# Patient Record
Sex: Male | Born: 1985
Health system: Southern US, Community
[De-identification: ages and names within clinical notes are randomized; demographics above are authoritative.]

## PROBLEM LIST (undated history)

## (undated) DIAGNOSIS — K219 Gastro-esophageal reflux disease without esophagitis: Secondary | ICD-10-CM

## (undated) HISTORY — DX: Gastro-esophageal reflux disease without esophagitis: K21.9

## (undated) HISTORY — PX: FRACTURE SURGERY: SHX138

---

## 2012-06-11 ENCOUNTER — Ambulatory Visit: Payer: Self-pay

## 2012-06-11 ENCOUNTER — Other Ambulatory Visit: Payer: Self-pay | Admitting: Occupational Medicine

## 2012-06-11 DIAGNOSIS — R52 Pain, unspecified: Secondary | ICD-10-CM

## 2012-12-03 ENCOUNTER — Ambulatory Visit (INDEPENDENT_AMBULATORY_CARE_PROVIDER_SITE_OTHER): Payer: BC Managed Care – PPO | Admitting: Physician Assistant

## 2012-12-03 VITALS — BP 120/66 | HR 50 | Temp 98.5°F | Resp 16 | Ht 67.5 in | Wt 176.0 lb

## 2012-12-03 DIAGNOSIS — J029 Acute pharyngitis, unspecified: Secondary | ICD-10-CM

## 2012-12-03 DIAGNOSIS — J069 Acute upper respiratory infection, unspecified: Secondary | ICD-10-CM

## 2012-12-03 MED ORDER — BENZONATATE 100 MG PO CAPS: 100.0000 mg | ORAL_CAPSULE | Freq: Three times a day (TID) | ORAL | Status: DC | PRN

## 2012-12-03 MED ORDER — IPRATROPIUM BROMIDE 0.03 % NA SOLN: 2.0000 | Freq: Two times a day (BID) | NASAL | Status: DC

## 2012-12-03 NOTE — Progress Notes (Signed)
  Subjective:    Patient ID: Jonathan Huff., male    DOB: May 30, 1986, 27 y.o.   MRN: [id number]  HPI   Mr. Therien is a very pleasant 27 yr old male here with concern for illness. States he is "trying to get over a cold" but feels like he's not getting any better.  Symptoms started about 5 days ago.  Experiencing sore throat, HA, runny nose, sneezing, coughing.  States he's coughing up "deep yellow" mucus. Does endorse post-nasal drainage.  No SOB or wheezing.  No fever.  Was having body aches initially, but now resolved.  Denies allergy problems.  No GI symptoms.  Fiance has been sick with similar symptoms.  Has been using Dayquil, Nyquil, Aleve for symptoms.  States ST is the worst symptom.  No strep contacts.     Review of Systems  Constitutional: Negative for fever and chills.  HENT: Positive for congestion, sore throat, rhinorrhea, sneezing and postnasal drip. Negative for ear pain.   Respiratory: Positive for cough. Negative for shortness of breath and wheezing.   Cardiovascular: Negative.   Gastrointestinal: Negative.   Musculoskeletal: Negative.   Skin: Negative.   Allergic/Immunologic: Negative for environmental allergies.  Neurological: Positive for headaches.       Objective:   Physical Exam  Vitals reviewed. Constitutional: He is oriented to person, place, and time. He appears well-developed and well-nourished. No distress.  HENT:  Head: Normocephalic and atraumatic.  Right Ear: Tympanic membrane and ear canal normal.  Left Ear: Tympanic membrane and ear canal normal.  Nose: Mucosal edema and rhinorrhea present.  Mouth/Throat: Uvula is midline, oropharynx is clear and moist and mucous membranes are normal.  Post-nasal drainage  Eyes: Conjunctivae are normal. No scleral icterus.  Neck: Neck supple.  Cardiovascular: Normal rate, regular rhythm and normal heart sounds.  Exam reveals no gallop and no friction rub.   No murmur heard. Pulmonary/Chest: Effort normal and  breath sounds normal. He has no decreased breath sounds. He has no wheezes. He has no rhonchi. He has no rales.  Lymphadenopathy:    He has no cervical adenopathy.  Neurological: He is alert and oriented to person, place, and time.  Skin: Skin is warm and dry.  Psychiatric: He has a normal mood and affect. His behavior is normal.     Filed Vitals:   12/03/12 1503  BP: 120/66  Pulse: 50  Temp: 98.5 F (36.9 C)  Resp: 16        Assessment & Plan:  Viral URI with cough - Plan: ipratropium (ATROVENT) 0.03 % nasal spray, benzonatate (TESSALON) 100 MG capsule  Viral pharyngitis   Mr. Okazaki is a 27 yr old male here with sore throat and URI symptoms.  Suspect viral/allergic etiology.  Pt is well-appearing, afebrile, lungs CTA, throat clear.  Will treat symptoms with Atrovent and Tessalon.  Encouraged warm salt water gargles.  Ibuprofen 600mg  TID for pain relief if needed . Push fluids, rest.  Discussed RTC precautions with pt who understands and is in agreement with this plan.

## 2012-12-03 NOTE — Patient Instructions (Addendum)
Begin using the Atrovent nasal spray twice daily - this will help with runny nose and post-nasal drainage.  Start an allergy medicine as well (Claritin/Zyrtec/Allegra) - this will also help with your symptoms of runny nose, sneezing, cough.  Tessalon Perles three times a day if needed for cough.  Warm salt water gargles.  You can take 600mg  ibuprofen every 8 hours if needed for sore throat.  If any of your symptoms are worsening or you start running fevers >101.38F please let me know.   Viral Pharyngitis Viral pharyngitis is a viral infection that produces redness, pain, and swelling (inflammation) of the throat. It can spread from person to person (contagious). CAUSES Viral pharyngitis is caused by inhaling a large amount of certain germs called viruses. Many different viruses cause viral pharyngitis. SYMPTOMS Symptoms of viral pharyngitis include:  Sore throat.  Tiredness.  Stuffy nose.  Low-grade fever.  Congestion.  Cough. TREATMENT Treatment includes rest, drinking plenty of fluids, and the use of over-the-counter medication (approved by your caregiver). HOME CARE INSTRUCTIONS   Drink enough fluids to keep your urine clear or pale yellow.  Eat soft, cold foods such as ice cream, frozen ice pops, or gelatin dessert.  Gargle with warm salt water (1 tsp salt per 1 qt of water).  If over age 3, throat lozenges may be used safely.  Only take over-the-counter or prescription medicines for pain, discomfort, or fever as directed by your caregiver. Do not take aspirin. To help prevent spreading viral pharyngitis to others, avoid:  Mouth-to-mouth contact with others.  Sharing utensils for eating and drinking.  Coughing around others. SEEK MEDICAL CARE IF:   You are better in a few days, then become worse.  You have a fever or pain not helped by pain medicines.  There are any other changes that concern you. Document Released: 05/10/2005 Document Revised: 10/23/2011 Document  Reviewed: 10/06/2010 [facility] Patient Information 2013 [place], [place].

## 2012-12-05 ENCOUNTER — Encounter: Payer: Self-pay | Admitting: Family Medicine

## 2014-04-29 ENCOUNTER — Ambulatory Visit (INDEPENDENT_AMBULATORY_CARE_PROVIDER_SITE_OTHER): Payer: BC Managed Care – PPO | Admitting: Emergency Medicine

## 2014-04-29 ENCOUNTER — Ambulatory Visit (INDEPENDENT_AMBULATORY_CARE_PROVIDER_SITE_OTHER): Payer: BC Managed Care – PPO

## 2014-04-29 VITALS — BP 102/62 | HR 50 | Temp 98.0°F | Resp 16 | Ht 67.0 in | Wt 184.2 lb

## 2014-04-29 DIAGNOSIS — M7989 Other specified soft tissue disorders: Secondary | ICD-10-CM

## 2014-04-29 DIAGNOSIS — S63639A Sprain of interphalangeal joint of unspecified finger, initial encounter: Secondary | ICD-10-CM

## 2014-04-29 NOTE — Patient Instructions (Signed)
Finger Sprain  A finger sprain is a tear in one of the strong, fibrous tissues that connect the bones (ligaments) in your finger. The severity of the sprain depends on how much of the ligament is torn. The tear can be either partial or complete.  CAUSES   Often, sprains are a result of a fall or accident. If you extend your hands to catch an object or to protect yourself, the force of the impact causes the fibers of your ligament to stretch too much. This excess tension causes the fibers of your ligament to tear.  SYMPTOMS   You may have some loss of motion in your finger. Other symptoms include:   Bruising.   Tenderness.   Swelling.  DIAGNOSIS   In order to diagnose finger sprain, your caregiver will physically examine your finger or thumb to determine how torn the ligament is. Your caregiver may also suggest an X-ray exam of your finger to make sure no bones are broken.  TREATMENT   If your ligament is only partially torn, treatment usually involves keeping the finger in a fixed position (immobilization) for a short period. To do this, your caregiver will apply a bandage, cast, or splint to keep your finger from moving until it heals. For a partially torn ligament, the healing process usually takes 2 to 3 weeks.  If your ligament is completely torn, you may need surgery to reconnect the ligament to the bone. After surgery a cast or splint will be applied and will need to stay on your finger or thumb for 4 to 6 weeks while your ligament heals.  HOME CARE INSTRUCTIONS   Keep your injured finger elevated, when possible, to decrease swelling.   To ease pain and swelling, apply ice to your joint twice a day, for 2 to 3 days:   Put ice in a plastic bag.   Place a towel between your skin and the bag.   Leave the ice on for 15 minutes.   Only take over-the-counter or prescription medicine for pain as directed by your caregiver.   Do not wear rings on your injured finger.   Do not leave your finger unprotected  until pain and stiffness go away (usually 3 to 4 weeks).   Do not allow your cast or splint to get wet. Cover your cast or splint with a plastic bag when you shower or bathe. Do not swim.   Your caregiver may suggest special exercises for you to do during your recovery to prevent or limit permanent stiffness.  SEEK IMMEDIATE MEDICAL CARE IF:   Your cast or splint becomes damaged.   Your pain becomes worse rather than better.  MAKE SURE YOU:   Understand these instructions.   Will watch your condition.   Will get help right away if you are not doing well or get worse.  Document Released: 09/07/2004 Document Revised: 10/23/2011 Document Reviewed: 04/03/2011  [employer] Patient Information 2015 [employer]. This information is not intended to replace advice given to you by your health care provider. Make sure you discuss any questions you have with your health care provider.

## 2014-04-29 NOTE — Progress Notes (Signed)
[facility] [address] [place] [address] [phone]  Date:  04/29/2014   Name:  Jonathan Huff.   DOB:  October 15, 1985   MRN:  [id number]  PCP:  No PCP Per Patient    Chief Complaint: Hand Pain   History of Present Illness:  Jonathan Stockman. is a 28 y.o. very pleasant male patient who presents with the following:  Injured his left ring when it was caught on a door.  Has swelling and pain in PIP and unable to remove ring. Has pain and limited motion. No improvement with over the counter medications or other home remedies.  Denies other complaint or health concern today.   There are no active problems to display for this patient.   No past medical history on file.  Past Surgical History  Procedure Laterality Date  . Fracture surgery      History  Substance Use Topics  . Smoking status: Current Some Day Smoker    Types: Cigars  . Smokeless tobacco: Not on file  . Alcohol Use: 2.4 oz/week    4 Cans of beer per week    Family History  Problem Relation Age of Onset  . Cancer Paternal Grandmother   . Pneumonia Paternal Grandfather     No Known Allergies  Medication list has been reviewed and updated.  No current outpatient prescriptions on file prior to visit.   No current facility-administered medications on file prior to visit.    Review of Systems:  As per HPI, otherwise negative.    Physical Examination: Filed Vitals:   04/29/14 2050  BP: 102/62  Pulse: 50  Temp: 98 F (36.7 C)  Resp: 16   Filed Vitals:   04/29/14 2050  Height:  (1.702 m)  Weight: 184 lb 3.2 oz (83.553 kg)   Body mass index is 28.84 kg/(m^2). Ideal Body Weight: Weight in (lb) to have BMI = 25: 159.3   GEN: WDWN, NAD, Non-toxic, Alert & Oriented x 3 HEENT: Atraumatic, Normocephalic.  Ears and Nose: No external deformity. EXTR: No clubbing/cyanosis/edema NEURO: Normal gait.  PSYCH: Normally interactive. Conversant. Not depressed or  anxious appearing.  Calm demeanor.  LEFT hand:  Tender and swollen left fourth finger PIP   Assessment and Plan: PIP sprain Buddy tape  Signed,  Phillips Odor, MD   [facility] reading (PRIMARY) by  Dr. Dareen Piano. negative.

## 2015-01-17 ENCOUNTER — Ambulatory Visit (INDEPENDENT_AMBULATORY_CARE_PROVIDER_SITE_OTHER): Payer: BLUE CROSS/BLUE SHIELD | Admitting: Emergency Medicine

## 2015-01-17 VITALS — BP 114/84 | HR 62 | Temp 98.8°F | Resp 16 | Ht 67.5 in | Wt 195.4 lb

## 2015-01-17 DIAGNOSIS — J029 Acute pharyngitis, unspecified: Secondary | ICD-10-CM | POA: Diagnosis not present

## 2015-01-17 DIAGNOSIS — J302 Other seasonal allergic rhinitis: Secondary | ICD-10-CM

## 2015-01-17 LAB — POCT RAPID STREP A (OFFICE): Rapid Strep A Screen: NEGATIVE

## 2015-01-17 MED ORDER — TRIAMCINOLONE ACETONIDE 55 MCG/ACT NA AERO: 2.0000 | INHALATION_SPRAY | Freq: Every day | NASAL | Status: DC

## 2015-01-17 NOTE — Patient Instructions (Signed)

## 2015-01-17 NOTE — Progress Notes (Signed)
Subjective:  Patient ID: Jonathan PillowGerald Amiri Jr., male    DOB: Jul 27, 1986  Age: 29 y.o. MRN: [id number]  CC: Sore Throat and Cough   HPI Jonathan PillowGerald Voong Jr. presents  With a cough and sore throat. He has clear nasal drainage. He has no knee postnasal drainage. He has no fever or chills. He has no wheezing or shortness of breath. He's had no nausea or vomiting. No stool change. No rash. Her throat is been present for 3 weeks.  He has no improvement with over-the-counter medication.  No outpatient prescriptions prior to visit.   No facility-administered medications prior to visit.    History   Social History  . Marital Status: Married    Spouse Name: N/A  . Number of Children: N/A  . Years of Education: N/A   Social History Main Topics  . Smoking status: Current Some Day Smoker    Types: Cigars  . Smokeless tobacco: Not on file  . Alcohol Use: 2.4 oz/week    4 Cans of beer per week  . Drug Use: No  . Sexual Activity: No   Other Topics Concern  . None   Social History Narrative    Family History  Problem Relation Age of Onset  . Cancer Paternal Grandmother   . Pneumonia Paternal Grandfather     History reviewed. No pertinent past medical history.   Review of Systems  Constitutional: Negative for fever, chills and appetite change.  HENT: Positive for congestion, rhinorrhea and sore throat. Negative for ear pain, postnasal drip and sinus pressure.   Eyes: Negative for pain and redness.  Respiratory: Positive for cough. Negative for shortness of breath and wheezing.   Cardiovascular: Negative for leg swelling.  Gastrointestinal: Negative for nausea, vomiting, abdominal pain, diarrhea, constipation and blood in stool.  Endocrine: Negative for polyuria.  Genitourinary: Negative for dysuria, urgency, frequency and flank pain.  Musculoskeletal: Negative for gait problem.  Skin: Negative for rash.  Neurological: Negative for weakness and headaches.    Psychiatric/Behavioral: Negative for confusion and decreased concentration. The patient is not nervous/anxious.     Objective:  BP 114/84 mmHg  Pulse 62  Temp(Src) 98.8 F (37.1 C) (Oral)  Resp 16  Ht 5' 7.5" (1.715 m)  Wt 195 lb 6 oz (88.622 kg)  BMI 30.13 kg/m2  SpO2 98%  BP Readings from Last 3 Encounters:  01/17/15 114/84  04/29/14 102/62  12/03/12 120/66    Wt Readings from Last 3 Encounters:  01/17/15 195 lb 6 oz (88.622 kg)  04/29/14 184 lb 3.2 oz (83.553 kg)  12/03/12 176 lb (79.833 kg)    Physical Exam  Constitutional: He is oriented to person, place, and time. He appears well-developed and well-nourished.  HENT:  Head: Normocephalic and atraumatic.  Right Ear: External ear normal.  Mouth/Throat: Oropharynx is clear and moist. No oropharyngeal exudate.  Eyes: Conjunctivae are normal. Pupils are equal, round, and reactive to light.  Neck: Normal range of motion. Neck supple.  Cardiovascular: Normal rate and normal heart sounds.   Pulmonary/Chest: Effort normal and breath sounds normal. He has no rales.  Abdominal: Soft. Bowel sounds are normal.  Musculoskeletal: Normal range of motion. He exhibits no edema.  Neurological: He is alert and oriented to person, place, and time.  Skin: Skin is warm and dry.  Psychiatric: He has a normal mood and affect. His behavior is normal. Thought content normal.    No results found for: WBC, HGB, HCT, PLT, GLUCOSE, CHOL, TRIG, HDL, LDLDIRECT, LDLCALC,  ALT, AST, NA, K, CL, CREATININE, BUN, CO2, TSH, PSA, INR, GLUF, HGBA1C, MICROALBUR    .  Assessment & Plan:   Garyson was seen today for sore throat and cough.  Diagnoses and all orders for this visit:  Pharyngitis Orders: -     POCT rapid strep A  Seasonal allergic rhinitis   Mr. Helderman does not currently have medications on file.  No orders of the defined types were placed in this encounter.    Appropriate red flag conditions were discussed with the patient  as well as actions that should be taken.  Patient expressed his understanding.  Follow-up: No Follow-up on file.  Carmelina Dane, MD    Results for orders placed or performed in visit on 01/17/15  POCT rapid strep A  Result Value Ref Range   Rapid Strep A Screen Negative Negative

## 2017-01-17 ENCOUNTER — Encounter: Payer: Self-pay | Admitting: Family Medicine

## 2017-01-17 ENCOUNTER — Ambulatory Visit (INDEPENDENT_AMBULATORY_CARE_PROVIDER_SITE_OTHER): Payer: BLUE CROSS/BLUE SHIELD | Admitting: Family Medicine

## 2017-01-17 VITALS — BP 122/80 | HR 60 | Resp 12 | Ht 67.5 in | Wt 205.5 lb

## 2017-01-17 DIAGNOSIS — E6609 Other obesity due to excess calories: Secondary | ICD-10-CM | POA: Diagnosis not present

## 2017-01-17 DIAGNOSIS — E669 Obesity, unspecified: Secondary | ICD-10-CM | POA: Insufficient documentation

## 2017-01-17 DIAGNOSIS — M25561 Pain in right knee: Secondary | ICD-10-CM

## 2017-01-17 DIAGNOSIS — M25562 Pain in left knee: Secondary | ICD-10-CM

## 2017-01-17 DIAGNOSIS — R29898 Other symptoms and signs involving the musculoskeletal system: Secondary | ICD-10-CM

## 2017-01-17 DIAGNOSIS — G479 Sleep disorder, unspecified: Secondary | ICD-10-CM

## 2017-01-17 DIAGNOSIS — K219 Gastro-esophageal reflux disease without esophagitis: Secondary | ICD-10-CM | POA: Diagnosis not present

## 2017-01-17 DIAGNOSIS — G8929 Other chronic pain: Secondary | ICD-10-CM

## 2017-01-17 DIAGNOSIS — Z6831 Body mass index (BMI) 31.0-31.9, adult: Secondary | ICD-10-CM

## 2017-01-17 DIAGNOSIS — R5383 Other fatigue: Secondary | ICD-10-CM

## 2017-01-17 LAB — CBC
HEMATOCRIT: 46 % (ref 39.0–52.0)
HEMOGLOBIN: 15 g/dL (ref 13.0–17.0)
MCHC: 32.6 g/dL (ref 30.0–36.0)
MCV: 71.3 fl — AB (ref 78.0–100.0)
Platelets: 255 10*3/uL (ref 150.0–400.0)
RBC: 6.44 Mil/uL — ABNORMAL HIGH (ref 4.22–5.81)
RDW: 15.8 % — ABNORMAL HIGH (ref 11.5–15.5)
WBC: 4.3 10*3/uL (ref 4.0–10.5)

## 2017-01-17 LAB — BASIC METABOLIC PANEL
BUN: 10 mg/dL (ref 6–23)
CHLORIDE: 103 meq/L (ref 96–112)
CO2: 28 meq/L (ref 19–32)
CREATININE: 0.98 mg/dL (ref 0.40–1.50)
Calcium: 10.1 mg/dL (ref 8.4–10.5)
GFR: 113.85 mL/min (ref >60.00)
GLUCOSE: 88 mg/dL (ref 70–99)
Potassium: 4.4 mEq/L (ref 3.5–5.1)
Sodium: 139 mEq/L (ref 135–145)

## 2017-01-17 LAB — TSH: TSH: 1.56 u[IU]/mL (ref 0.35–4.50)

## 2017-01-17 MED ORDER — OMEPRAZOLE 20 MG PO CPDR: 20.0000 mg | DELAYED_RELEASE_CAPSULE | Freq: Every day | ORAL | 3 refills | Status: DC

## 2017-01-17 NOTE — Patient Instructions (Signed)
A few things to remember from today's visit:   Fatigue, unspecified type - Plan: Basic metabolic panel, TSH, CBC  Gastroesophageal reflux disease, esophagitis presence not specified - Plan: omeprazole (PRILOSEC) 20 MG capsule  Class 1 obesity due to excess calories without serious comorbidity with body mass index (BMI) of 31.0 to 31.9 in adult  Sleep disorder, unspecified - Plan: Ambulatory referral to Pulmonology  Bilateral chronic knee pain - Plan: Ambulatory referral to Sports Medicine  Popliteal fullness - Plan: Ambulatory referral to Sports Medicine     Avoid foods that make your symptoms worse, for example coffee, chocolate,pepermeint,alcohol, and greasy food. Raising the head of your bed about 6 inches may help with nocturnal symptoms.  Avoid tobacco use. Weight loss (if you are overweight). Avoid lying down for 3 hours after eating.  Instead 3 large meals daily try small and more frequent meals during the day.  Every medication have side effects and medications for GERD are not the exception.At this time I think benefit is greater than risk.   You should be evaluated immediately if bloody vomiting, bloody stools, black stools (like tar), difficulty swallowing, food gets stuck on the way down or choking when eating. Abnormal weight loss or severe abdominal pain.  If symptoms are not resolved sometimes endoscopy is necessary.  Please be sure medication list is accurate. If a new problem present, please set up appointment sooner than planned today.

## 2017-01-20 ENCOUNTER — Encounter: Payer: Self-pay | Admitting: Family Medicine

## 2017-01-20 NOTE — Progress Notes (Signed)
HPI:   [name] is a 31 y.o. male, who is here today to establish care.  Former PCP: Dr Jonathan Huff. Last preventive routine visit: Over 2 years ago.  Chronic medical problems: He denies DM,HTN,or HLD. Knee pain.   Concerns today:   He is concerned about possible OSA. Fatigue for about 6 months, he does not feel rested when he gets up.   He wakes up a couple times to go to bathroom and other times because heartburn if he eats late.   Wife has noted that he is snoring louder. According to pt, in 10/2016 he stayed with his father,who told him that he noted apnea during sleep.   GERD: Heartburn for about 4-6 months, he has episodes about 3 times per week.  Denies abdominal pain, nausea, vomiting, changes in bowel habits, blood in stool or melena.   + Smoker.  He is eating fast food for the 6 months, about 4 times per week. He is not exercising regularly.  -Knee pain, bilateral but L>R, worse for the past 3 weeks,when he tried to go back to running. He denies injuries. Right knee bothers him "every now and then." He has noted tightness sensation on the posterior aspect of left knee. Pain is exacerbated by walking, "very painful", alleviated by rest.  He has not noted erythema or edema. No limitation of ROM. He has not tried OTC analgesics for pain.  He also has occasional shoulders pain.  Review of Systems  Constitutional: Positive for fatigue. Negative for activity change, appetite change, fever and unexpected weight change.  HENT: Negative for mouth sores, nosebleeds, sore throat and trouble swallowing.   Eyes: Negative for redness and visual disturbance.  Respiratory: Positive for apnea. Negative for cough, shortness of breath and wheezing.   Cardiovascular: Negative for chest pain, palpitations and leg swelling.  Gastrointestinal: Negative for abdominal pain, nausea and vomiting.       No changes in bowel habits.   Endocrine: Negative for cold  intolerance and heat intolerance.  Genitourinary: Negative for decreased urine volume, dysuria and hematuria.  Musculoskeletal: Positive for arthralgias. Negative for back pain and gait problem.  Neurological: Negative for weakness, numbness and headaches.  Hematological: Negative for adenopathy. Does not bruise/bleed easily.  Psychiatric/Behavioral: Positive for sleep disturbance. Negative for confusion. The patient is not nervous/anxious.     No current outpatient prescriptions on file prior to visit.   No current facility-administered medications on file prior to visit.     Past Medical History:  Diagnosis Date  . GERD (gastroesophageal reflux disease)    No Known Allergies  Family History  Problem Relation Age of Onset  . Hypertension Mother   . Diabetes Mother   . Hypertension Father   . Cancer Paternal Grandmother   . Pneumonia Paternal Grandfather     Social History   Social History  . Marital status: Married    Spouse name: N/A  . Number of children: N/A  . Years of education: N/A   Social History Main Topics  . Smoking status: Current Some Day Smoker    Types: Cigars  . Smokeless tobacco: Never Used  . Alcohol use 2.4 oz/week    4 Cans of beer per week  . Drug use: No  . Sexual activity: Not Asked   Other Topics Concern  . None   Social History Narrative  . None    Vitals:   01/17/17 1100  BP: 122/80  Pulse: 60  Resp:  12   O2 sat at RA 98% Body mass index is 31.71 kg/m.   Physical Exam  Nursing note and vitals reviewed. Constitutional: He is oriented to person, place, and time. He appears well-developed. No distress.  HENT:  Head: Atraumatic.  Mouth/Throat: Oropharynx is clear and moist and mucous membranes are normal.  Eyes: Conjunctivae and EOM are normal. Pupils are equal, round, and reactive to light.  Neck: No tracheal deviation present. No thyroid mass and no thyromegaly present.  Cardiovascular: Normal rate and regular rhythm.     No murmur heard. Pulses:      Dorsalis pedis pulses are 2+ on the right side, and 2+ on the left side.  Respiratory: Effort normal and breath sounds normal. No respiratory distress.  GI: Soft. He exhibits no mass. There is no hepatomegaly. There is no tenderness.  Musculoskeletal: He exhibits no edema.       Right knee: No tenderness found.  Knees with normal ROM, no crepitus,effusion of deformities. Stress valgus and varus normal. Anterior and posterior drawer negative bilateral. Posterior aspect of left knee minimal fullness medial aspect of poplitea fossa. Upon palpation nodular lesion, about 3-4 cm, no tender, and soft/tense.   Lymphadenopathy:    He has no cervical adenopathy.  Neurological: He is alert and oriented to person, place, and time. He has normal strength. Coordination and gait normal.  Skin: Skin is warm. No rash noted. No erythema.  Psychiatric: He has a normal mood and affect. Cognition and memory are normal.  Well groomed, good eye contact.    ASSESSMENT AND PLAN:   Jonathan HansenGerald was seen today for establish care.  Diagnoses and all orders for this visit:  Lab Results  Component Value Date   TSH 1.56 01/17/2017   Lab Results  Component Value Date   WBC 4.3 01/17/2017   HGB 15.0 01/17/2017   HCT 46.0 01/17/2017   MCV 71.3 (L) 01/17/2017   PLT 255.0 01/17/2017   Lab Results  Component Value Date   CREATININE 0.98 01/17/2017   BUN 10 01/17/2017   NA 139 01/17/2017   K 4.4 01/17/2017   CL 103 01/17/2017   CO2 28 01/17/2017    Fatigue, unspecified type  We discussed possible causes. Interrupted sleep may aggravate problem. ? OSA, referral to pulmonologist placed. Healthy dietary habits and regular exercise may help.  Further recommendations will be given according to lab results.  -     Basic metabolic panel -     TSH -     CBC  Gastroesophageal reflux disease, esophagitis presence not specified  GERD precautions discussed. He agrees with  trying Omeprazole low dose for 2-3 months. Instructed about warning signs.  -     omeprazole (PRILOSEC) 20 MG capsule; Take 1 capsule (20 mg total) by mouth daily before breakfast.  Class 1 obesity due to excess calories without serious comorbidity with body mass index (BMI) of 31.0 to 31.9 in adult  We discussed benefits of wt loss as well as adverse effects of obesity. Consistency with healthy diet and physical activity recommended. Daily brisk walking for 15-30 min as tolerated.  Sleep disorder, unspecified  Witnessed apnea and fatigue, ? OSA. Wt loss may help.  -     Ambulatory referral to Pulmonology  Bilateral chronic knee pain  ? Patello femoral synd among some to consider. Wt gain may aggravate problem. I do not think imaging is necessary today.  -     Ambulatory referral to Sports Medicine  Popliteal fullness  ?  Baker's cyst left knee. He is not interested in surgeries. Sport medicine evaluation requested.  -     Ambulatory referral to Sports Medicine     Tahira Olivarez G. [place], MD  [facility]. [place] office.

## 2017-01-24 ENCOUNTER — Ambulatory Visit (INDEPENDENT_AMBULATORY_CARE_PROVIDER_SITE_OTHER): Payer: BLUE CROSS/BLUE SHIELD | Admitting: Sports Medicine

## 2017-01-24 ENCOUNTER — Ambulatory Visit (INDEPENDENT_AMBULATORY_CARE_PROVIDER_SITE_OTHER): Payer: BLUE CROSS/BLUE SHIELD

## 2017-01-24 ENCOUNTER — Other Ambulatory Visit: Payer: Self-pay | Admitting: Sports Medicine

## 2017-01-24 ENCOUNTER — Encounter: Payer: Self-pay | Admitting: Sports Medicine

## 2017-01-24 VITALS — BP 124/90 | HR 62 | Ht 67.5 in | Wt 205.6 lb

## 2017-01-24 DIAGNOSIS — M25562 Pain in left knee: Secondary | ICD-10-CM

## 2017-01-24 DIAGNOSIS — M25462 Effusion, left knee: Secondary | ICD-10-CM | POA: Diagnosis not present

## 2017-01-24 DIAGNOSIS — M25561 Pain in right knee: Secondary | ICD-10-CM | POA: Diagnosis not present

## 2017-01-24 DIAGNOSIS — M222X2 Patellofemoral disorders, left knee: Secondary | ICD-10-CM | POA: Diagnosis not present

## 2017-01-24 DIAGNOSIS — G8929 Other chronic pain: Secondary | ICD-10-CM | POA: Diagnosis not present

## 2017-01-24 DIAGNOSIS — M222X1 Patellofemoral disorders, right knee: Secondary | ICD-10-CM | POA: Diagnosis not present

## 2017-01-24 DIAGNOSIS — M25461 Effusion, right knee: Secondary | ICD-10-CM | POA: Diagnosis not present

## 2017-01-24 MED ORDER — DICLOFENAC SODIUM 2 % TD SOLN: 1.0000 "application " | Freq: Two times a day (BID) | TRANSDERMAL | 2 refills | Status: DC

## 2017-01-24 MED ORDER — DICLOFENAC SODIUM 2 % TD SOLN: 1.0000 "application " | Freq: Two times a day (BID) | TRANSDERMAL | 0 refills | Status: AC

## 2017-01-24 NOTE — Assessment & Plan Note (Signed)
Symptoms do seem to be most consistent with patellofemoral pain syndrome.  He does have poor VMO definition and no focal findings on exam.  I suspect he is having some functional knee instability were discussed appropriate therapeutic exercises.  Also discussed that he would like to return to activities for fitness including running and I like for him to hold off on this for 6 weeks but we did discuss nonimpact options.  Topical Pennsaid provided to be used twice daily for 2 weeks then as needed.  If any lack of improvement or worsening symptoms I am happy to see him back and consider further diagnostic evaluation with [facility] ultrasound and consideration of intra-articular injection at that time.

## 2017-01-24 NOTE — Progress Notes (Signed)
OFFICE VISIT NOTE Jonathan FellsMichael D. [name], DO  [facility] [place] [phone]  Jonathan SearingGerald Kenneth Lorre NickRoberts Jr. - 31 y.o. male MRN [id number]  Date of birth: March 18, 1986  Visit Date: 01/24/2017  PCP: [place]Jordan, Betty G, MD   Referred by: [place], Betty G, MD  Autumn McNeil,cma acting as scribe for Dr. Berline Choughigby.  SUBJECTIVE:   Chief Complaint  Patient presents with  . Establish Care  . Bilateral Knee Pain   HPI: As below and per problem based documentation when appropriate.  Bilateral Knee Pain: Left is worse that right. Noticed sx 3-4 months ago with general routine activity and with stretching. He denies injuries. Location of pain is posterior aspect of both knees.  Describes pain has intermittent  tightness sensation. Limited ROM with flares are triggered.  He has not tried OTC analgesics for pain. Resting does give him some relief. No prior imaging.    Review of Systems  Constitutional: Negative for chills, diaphoresis, fever, malaise/fatigue and weight loss.  Cardiovascular: Positive for leg swelling. Negative for chest pain, palpitations and orthopnea.  Musculoskeletal: Positive for joint pain.  Neurological: Positive for dizziness. Negative for tingling, tremors and weakness.  Endo/Heme/Allergies: Does not bruise/bleed easily.    Otherwise per HPI.  HISTORY & PERTINENT PRIOR DATA:  No specialty comments available. He reports that he has been smoking Cigars.  He has never used smokeless tobacco. No results for input(s): HGBA1C, LABURIC in the last 8760 hours. Medications & Allergies reviewed per EMR Patient Active Problem List   Diagnosis Date Noted  . Chronic pain of both knees 01/24/2017  . GERD (gastroesophageal reflux disease) 01/17/2017  . Class 1 obesity with body mass index (BMI) of 31.0 to 31.9 in adult 01/17/2017   Past Medical History:  Diagnosis Date  . GERD (gastroesophageal reflux disease)    Family History  Problem Relation  Age of Onset  . Hypertension Mother   . Diabetes Mother   . Hypertension Father   . Cancer Paternal Grandmother   . Pneumonia Paternal Grandfather    Past Surgical History:  Procedure Laterality Date  . FRACTURE SURGERY     Social History   Occupational History  . Not on file.   Social History Main Topics  . Smoking status: Current Some Day Smoker    Types: Cigars  . Smokeless tobacco: Never Used  . Alcohol use 2.4 oz/week    4 Cans of beer per week  . Drug use: No  . Sexual activity: Not on file    OBJECTIVE:  VS:  HT:5' 7.5" (171.5 cm)   WT:205 lb 9.6 oz (93.3 kg)  BMI:31.8    BP:124/90  HR:62bpm  TEMP: ( )  RESP:98 % EXAM: Findings:  WDWN, NAD, Non-toxic appearing Alert & appropriately interactive Not depressed or anxious appearing No increased work of breathing. Pupils are equal. EOM intact without nystagmus No clubbing or cyanosis of the extremities appreciated No significant rashes/lesions/ulcerations overlying the examined area. DP & PT pulses 2+/4.  No significant pretibial edema. Sensation intact to light touch in lower extremities.  Bilateral Knee: Overall joint is well aligned, no significant deformity.  Poor VMO muscle mass No significant effusion.   ROM: 0 to 120.   Extensor mechanism intact No significant medial or lateral joint line tenderness.  He does have a small amount of swelling along the medial and lateral aspects of the patellar tendon that is mild and nontender.  Consistent with fat pad impingement. Stable with varus  and valgus strain & anterior/posterior drawer.     No focal mechanical symptoms with McMurray's or Thessaly.        No results found. ASSESSMENT & PLAN:   Problem List Items Addressed This Visit    Chronic pain of both knees    Symptoms do seem to be most consistent with patellofemoral pain syndrome.  He does have poor VMO definition and no focal findings on exam.  I suspect he is having some functional knee  instability were discussed appropriate therapeutic exercises.  Also discussed that he would like to return to activities for fitness including running and I like for him to hold off on this for 6 weeks but we did discuss nonimpact options.  Topical Pennsaid provided to be used twice daily for 2 weeks then as needed.  If any lack of improvement or worsening symptoms I am happy to see him back and consider further diagnostic evaluation with MSK ultrasound and consideration of intra-articular injection at that time.        Relevant Orders   DG Knee 1-2 Views Left    Other Visit Diagnoses    Patellofemoral pain syndrome of both knees    -  Primary      Follow-up: Return in about 6 weeks (around 03/07/2017).   CMA/ATC served as Neurosurgeon during this visit. History, Physical, and Plan performed by medical provider. Documentation and orders reviewed and attested to.      Gaspar Bidding, DO    [facility]

## 2017-01-24 NOTE — Assessment & Plan Note (Signed)
Topical NSAID should be tolerated well.

## 2017-03-21 ENCOUNTER — Ambulatory Visit: Payer: Self-pay | Admitting: Sports Medicine

## 2017-03-21 DIAGNOSIS — Z0289 Encounter for other administrative examinations: Secondary | ICD-10-CM

## 2017-03-21 NOTE — Progress Notes (Deleted)
  OFFICE VISIT NOTE [name], DO  [name] - 31 y.o. male MRN [id number]  Date of birth: 05-28-1986  Visit Date: 03/21/2017  PCP: [place], Betty G, MD   Referred by: [place], Betty G, MD  Jonathan DakinBrandy Huff, CMA acting as scribe for Dr. Berline Choughigby.  SUBJECTIVE:  No chief complaint on file.  HPI: As below and per problem based documentation when appropriate.  Jonathan Huff is an established patient presenting today in follow-up of chronic bilateral knee pain, patellofemoral syndrome. He was last seen 01/24/2017 and had xray of both knees. He was provided with topical Pennsaid and advised to avoid running x 6 weeks.     ROS  Otherwise per HPI.  HISTORY & PERTINENT PRIOR DATA:  No specialty comments available. He reports that he has been smoking Cigars.  He has never used smokeless tobacco. No results for input(s): HGBA1C, LABURIC in the last 8760 hours. Medications & Allergies reviewed per EMR Patient Active Problem List   Diagnosis Date Noted  . Chronic pain of both knees 01/24/2017  . GERD (gastroesophageal reflux disease) 01/17/2017  . Class 1 obesity with body mass index (BMI) of 31.0 to 31.9 in adult 01/17/2017   Past Medical History:  Diagnosis Date  . GERD (gastroesophageal reflux disease)    Family History  Problem Relation Age of Onset  . Hypertension Mother   . Diabetes Mother   . Hypertension Father   . Cancer Paternal Grandmother   . Pneumonia Paternal Grandfather    Past Surgical History:  Procedure Laterality Date  . FRACTURE SURGERY     Social History   Occupational History  . Not on file.   Social History Main Topics  . Smoking status: Current Some Day Smoker    Types: Cigars  . Smokeless tobacco: Never Used  . Alcohol use 2.4 oz/week    4 Cans of beer per week  . Drug use: No  . Sexual activity: Not on file    OBJECTIVE:  VS:  HT:    WT:   BMI:      BP:   HR: bpm  TEMP: ( )  RESP:  EXAM: No additional findings.    No results found. ASSESSMENT & PLAN:  { }

## 2017-03-23 ENCOUNTER — Encounter: Payer: Self-pay | Admitting: Sports Medicine

## 2017-03-23 ENCOUNTER — Ambulatory Visit (INDEPENDENT_AMBULATORY_CARE_PROVIDER_SITE_OTHER): Payer: BLUE CROSS/BLUE SHIELD | Admitting: Sports Medicine

## 2017-03-23 VITALS — BP 110/76 | HR 61 | Ht 67.5 in | Wt 206.8 lb

## 2017-03-23 DIAGNOSIS — M25562 Pain in left knee: Secondary | ICD-10-CM | POA: Diagnosis not present

## 2017-03-23 DIAGNOSIS — M222X2 Patellofemoral disorders, left knee: Secondary | ICD-10-CM

## 2017-03-23 DIAGNOSIS — E6609 Other obesity due to excess calories: Secondary | ICD-10-CM

## 2017-03-23 DIAGNOSIS — Z6831 Body mass index (BMI) 31.0-31.9, adult: Secondary | ICD-10-CM | POA: Diagnosis not present

## 2017-03-23 DIAGNOSIS — M222X1 Patellofemoral disorders, right knee: Secondary | ICD-10-CM

## 2017-03-23 DIAGNOSIS — G479 Sleep disorder, unspecified: Secondary | ICD-10-CM | POA: Diagnosis not present

## 2017-03-23 DIAGNOSIS — G8929 Other chronic pain: Secondary | ICD-10-CM | POA: Diagnosis not present

## 2017-03-23 DIAGNOSIS — M25561 Pain in right knee: Secondary | ICD-10-CM

## 2017-03-23 NOTE — Patient Instructions (Signed)
Also check out the YouTube Video from Dr. Myles LippsEric Goodman.  I would like to see you try performing this 5-6 days per week.    A good intro video is: "Independence from Pain 7-minute Video" - [email or web address]://[email or web address]://www.[email or web address]/[email or web address]   His more advanced video is: "Powerful Posture and Pain Relief: 12 minutes of Foundation Training" - https://youtu.be/[email or web address]   Do not try to attempt this entire video when first beginning.    Try breaking of each exercise that he goes into shorter segments.  Otherwise if they perform an exercise for 45 seconds, start with 15 seconds and rest and then resume with a begin the new activity.  Work your way up to doing this 12 minute video and I expect to see significant improvements in your pain.    Here are some basic nutrition rules to remember:  "Eat Real Foods & Drink Real Drinks" - if you think it was made in a factory . . it is best to avoid as a staple in your diet.  Limiting processed  foods to 1-2 times per week is a good idea.  Food that came from the ground, from a tree, from a plant or is a plant, is food that you can essentially eat as much of it he would like as long as it looks like it did when it came from that place!!!  I've never seen a french fry come out of the ground!  Obviously everything should be eaten in moderation but this can be generally applied.   Limit your salt intake (in general aim for less than 3000mg  per day). Sticking with fresh fruits and vegetables as well as home cooked meals will typically provide more nutrition and less salt than prepackaged meals.     Limit the amount of sugar sweetened and artificially sweetened foods and beverages.  Avoid soda, juices and generally any bottled beverage is a good idea.  Sticking with water flavored with a slice of lemon, lime or orange is a great option if you want something with flavor in it.  Using flavored seltzer water to flavor plain water will also add some bite if you want something  more than flavor.      Eat at least 3 meals and 1-2 snacks per day.  Aim for no more than 5 hours between eating.  This will actually increase your metabolism and help prevent you from overeating.   Here are 2 of my favorite web sites that provide great nutrition and exercise advice.   www.eatsmartmovemore[place].com Www.choosemyplate.gov

## 2017-03-23 NOTE — Progress Notes (Signed)
OFFICE VISIT NOTE Veverly Fells. [facility] [phone]  Talbert Trembath Troye Hiemstra. - 31 y.o. male MRN [id number]  Date of birth: 06-09-1986  Visit Date: 03/23/2017  PCP: [place], Betty G, MD   Referred by: [place], Betty G, MD  Orlie Dakin, CMA acting as scribe for Dr. Berline Chough.  SUBJECTIVE:   Chief Complaint  Patient presents with  . Follow-up    patellofemoral pain syndrome of both knees   HPI: As below and per problem based documentation when appropriate.  Mr Carchi is an established patient presenting today in follow-up of patellofemoral pain syndrome of both knees. He was last seen in the office 01/24/17 and had xrays done. He was prescribed Pennsaid and advised to avoid running x 6 weeks.   Pt reports that Pennsaid has been working well though he has not been using it as prescribed. He has been avoiding running since his last visit. He is not having any pain behind either knee now. The pain subsided about 1.5 weeks after his last office visit. He no longer has pain when squatting. He has noticed some pressure in his feet when he wakes up in the morning but this subsides after he gets up and walks around.     Review of Systems  Constitutional: Negative for chills and fever.  Respiratory: Negative for shortness of breath and wheezing.   Cardiovascular: Negative for chest pain, palpitations and leg swelling.  Musculoskeletal: Negative for falls and joint pain.  Neurological: Negative for dizziness, tingling and headaches.  Endo/Heme/Allergies: Does not bruise/bleed easily.    Otherwise per HPI.  HISTORY & PERTINENT PRIOR DATA:  No specialty comments available. He reports that he has been smoking Cigars.  He has never used smokeless tobacco. No results for input(s): HGBA1C, LABURIC in the last 8760 hours. Medications & Allergies reviewed per EMR Patient Active Problem List   Diagnosis Date Noted  . Chronic pain of  both knees 01/24/2017  . GERD (gastroesophageal reflux disease) 01/17/2017  . Class 1 obesity with body mass index (BMI) of 31.0 to 31.9 in adult 01/17/2017   Past Medical History:  Diagnosis Date  . GERD (gastroesophageal reflux disease)    Family History  Problem Relation Age of Onset  . Hypertension Mother   . Diabetes Mother   . Hypertension Father   . Cancer Paternal Grandmother   . Pneumonia Paternal Grandfather    Past Surgical History:  Procedure Laterality Date  . FRACTURE SURGERY     Social History   Occupational History  . Not on file.   Social History Main Topics  . Smoking status: Current Some Day Smoker    Types: Cigars  . Smokeless tobacco: Never Used  . Alcohol use 2.4 oz/week    4 Cans of beer per week  . Drug use: No  . Sexual activity: Not on file    OBJECTIVE:  VS:  HT:5' 7.5" (171.5 cm)   WT:206 lb 12.8 oz (93.8 kg)  BMI:32    BP:110/76  HR:61bpm  TEMP: ( )  RESP:96 % EXAM: Findings:  WDWN, NAD, Non-toxic appearing Alert & appropriately interactive Not depressed or anxious appearing No increased work of breathing. Pupils are equal. EOM intact without nystagmus No clubbing or cyanosis of the extremities appreciated No significant rashes/lesions/ulcerations overlying the examined area. DP & PT pulses 2+/4.  No significant pretibial edema.  No clubbing or cyanosis Sensation intact to light touch in lower extremities.  Bilateral Knee: Overall joint is well aligned, no significant deformity.   Small effusions bilaterally but this is mild.  0 to 115 symmetrically   ROM: 0 to 115.   Extensor mechanism intact Generalized medial and lateral joint line pain which is mild..   Stable to varus/valgus strain & anterior/posterior drawer.  Normal Lachman's.   Slight pain with McMurray's but this is minimal and no reproducible mechanical symptoms.       No results found. ASSESSMENT & PLAN:     ICD-10-CM   1. Chronic pain of both knees  M25.561    M25.562    G89.29   2. Patellofemoral pain syndrome of both knees M22.2X1    M22.2X2   3. Class 1 obesity due to excess calories without serious comorbidity with body mass index (BMI) of 31.0 to 31.9 in adult E66.09    Z68.31   4. Sleep disorder, unspecified G47.9   ================================================================= Class 1 obesity with body mass index (BMI) of 31.0 to 31.9 in adult >50% of this 25 minute visit spent in direct patient counseling and/or coordination of care.  Discussion was focused on education regarding the in discussing the pathoetiology and anticipated clinical course of the above condition.  We discussed how his weight is affecting his pain as well as strategies to help mitigate this including nutritional changes and  low impact weightbearing. =================================================================  Follow-up: Return if symptoms worsen or fail to improve.   CMA/ATC served as Neurosurgeonscribe during this visit. History, Physical, and Plan performed by medical provider. Documentation and orders reviewed and attested to.      Gaspar BiddingMichael Rigby, DO    [facility]

## 2017-04-09 NOTE — Assessment & Plan Note (Signed)
>  50% of this 25 minute visit spent in direct patient counseling and/or coordination of care.  Discussion was focused on education regarding the in discussing the pathoetiology and anticipated clinical course of the above condition.  We discussed how his weight is affecting his pain as well as strategies to help mitigate this including nutritional changes and  low impact weightbearing.

## 2017-04-23 NOTE — Progress Notes (Signed)
HPI:  [name]. is a 31 y.o.male here today for his routine physical examination.  Last CPE 2 years ago.  He lives with wife and newborn, 31 days old.  Regular exercise 3 or more times per week: Not consistently. Following a healthy diet: Usually he cooks but sine baby was born friends a bringing food.   Chronic medical problems: Chronic knee pain, GERD, and tobacco use among some.  Hx of STD's: Denies.   -Denies high alcohol intake or Hx of illicit drug use. + Occasional smoker, cigar "every few months."   -Concerns today:  Rash: Right foot for about 2 months after trip to the beach.Pruritic, initially he had some "little" vesicles. No known exposure,no Hx of eczema. He has not used OTC medication. Improving.   "Bump" in right axilla, which he has had intermittent for a while, usually resolves spontaneously. He has had this lesion for about a week, he has noted intermittent purulent drainage. He denies any fever, chills, body aches.   GERD:  Currently he is on Prilosec 20 mg, which he takes most of the time. Heartburn exacerbated by spicy and [place] food. He has not taken medication for a week and having heartburn.  Denies abdominal pain, nausea, vomiting, changes in bowel habits, blood in stool or melena.   Review of Systems  Constitutional: Negative for activity change, appetite change, chills, fatigue, fever and unexpected weight change.  HENT: Negative for dental problem, hearing loss, mouth sores, nosebleeds, sore throat, trouble swallowing and voice change.   Eyes: Negative for redness and visual disturbance.  Respiratory: Negative for apnea, cough, shortness of breath and wheezing.   Cardiovascular: Negative for chest pain, palpitations and leg swelling.  Gastrointestinal: Negative for abdominal pain, blood in stool, nausea and vomiting.       No changes in bowel habits.  Endocrine: Negative for cold intolerance, heat  intolerance, polydipsia, polyphagia and polyuria.  Genitourinary: Negative for decreased urine volume, dysuria, genital sores, hematuria and testicular pain.  Musculoskeletal: Positive for arthralgias. Negative for back pain, joint swelling and myalgias.  Skin: Positive for rash. Negative for wound.  Allergic/Immunologic: Negative for environmental allergies.  Neurological: Negative for dizziness, syncope, weakness and headaches.  Hematological: Negative for adenopathy. Does not bruise/bleed easily.  Psychiatric/Behavioral: Positive for sleep disturbance (Has a 65 days old newborn). Negative for confusion. The patient is not nervous/anxious.   All other systems reviewed and are negative.    Current Outpatient Prescriptions on File Prior to Visit  Medication Sig Dispense Refill  . Diclofenac Sodium (PENNSAID) 2 % SOLN Place 1 application onto the skin 2 (two) times daily. 112 g 2  . omeprazole (PRILOSEC) 20 MG capsule Take 1 capsule (20 mg total) by mouth daily before breakfast. 30 capsule 3   No current facility-administered medications on file prior to visit.      Past Medical History:  Diagnosis Date  . GERD (gastroesophageal reflux disease)    Past Surgical History:  Procedure Laterality Date  . FRACTURE SURGERY     No Known Allergies  Family History  Problem Relation Age of Onset  . Hypertension Mother   . Diabetes Mother   . Hypertension Father   . Cancer Paternal Grandmother   . Pneumonia Paternal Grandfather     Social History   Social History  . Marital status: Married    Spouse name: N/A  . Number of children: N/A  . Years of education: N/A   Social History  Main Topics  . Smoking status: Current Some Day Smoker    Types: Cigars  . Smokeless tobacco: Never Used  . Alcohol use 2.4 oz/week    4 Cans of beer per week  . Drug use: No  . Sexual activity: Yes    Birth control/ protection: Condom   Other Topics Concern  . None   Social History Narrative    . None     Vitals:   04/24/17 0728  BP: 118/78  Pulse: 70  Resp: 12  SpO2: 96%   Body mass index is 32.46 kg/m.   Wt Readings from Last 3 Encounters:  04/24/17 210 lb 6 oz (95.4 kg)  03/23/17 206 lb 12.8 oz (93.8 kg)  01/24/17 205 lb 9.6 oz (93.3 kg)    Physical Exam  Nursing note and vitals reviewed. Constitutional: He is oriented to person, place, and time. He appears well-developed. No distress.  HENT:  Head: Normocephalic and atraumatic.  Right Ear: Hearing, tympanic membrane, external ear and ear canal normal.  Left Ear: Hearing, tympanic membrane, external ear and ear canal normal.  Mouth/Throat: Oropharynx is clear and moist and mucous membranes are normal.  Eyes: Pupils are equal, round, and reactive to light. Conjunctivae and EOM are normal.  Neck: Normal range of motion. No tracheal deviation present. No thyromegaly present.  Cardiovascular: Normal rate and regular rhythm.   No murmur heard. Pulses:      Dorsalis pedis pulses are 2+ on the right side, and 2+ on the left side.  Respiratory: Effort normal and breath sounds normal. No respiratory distress.  GI: Soft. He exhibits no mass. There is no tenderness.  Genitourinary:  Genitourinary Comments: Refused, no concerns.  Musculoskeletal: He exhibits no edema or tenderness.  No major deformities appreciated and no signs of synovitis.  Lymphadenopathy:    He has no cervical adenopathy.       Right: No supraclavicular adenopathy present.       Left: No supraclavicular adenopathy present.  Neurological: He is alert and oriented to person, place, and time. He has normal strength. No cranial nerve deficit or sensory deficit. Coordination and gait normal.  Reflex Scores:      Bicep reflexes are 2+ on the right side and 2+ on the left side.      Patellar reflexes are 2+ on the right side and 2+ on the left side. Skin: Skin is warm. Rash noted. Rash is nodular. There is erythema.  Right axilla 2 cm  nodular,erythematous lesion, active purulent drainage. Mildly tender. Right ankle: below medial malleolus and posterior to lateral malleolus with rounded,thick scaly lesions, about 2.5-3 cm, no tender.Medial lesion with small blisters and minimal erythema.  Psychiatric: He has a normal mood and affect.    ASSESSMENT AND PLAN:  Jonathan Huff was seen today for annual exam.  Diagnoses and all orders for this visit:  Lab Results  Component Value Date   CHOL 255 (H) 04/24/2017   HDL 39.60 04/24/2017   LDLCALC 195 (H) 04/24/2017   TRIG 102.0 04/24/2017   CHOLHDL 6 04/24/2017    Encounter for general adult medical examination with abnormal findings  We discussed the importance of regular physical activity and healthy diet for prevention of chronic illness and/or complications. Preventive guidelines reviewed. Smoking cessation encouraged. Vaccination updated. Next CPE in 1 year.  Abscess of right axilla  Drained 1.5-2 cc purulent material,tolerated procedure well. Warm compresses and oral abx recommended,some side effects discussed. Instructed about warning signs. F/U as needed.  -  doxycycline (VIBRA-TABS) 100 MG tablet; Take 1 tablet (100 mg total) by mouth 2 (two) times daily.  Dermatitis, unspecified  ? Eczema. Topical steroid recommended,some side effects discussed. Moisturizer on area,vaseline is a good option. F/U as needed.  -     triamcinolone ointment (KENALOG) 0.5 %; Apply 1 application topically 2 (two) times daily. 14 days at the time.  Screening for lipid disorders -     Lipid panel  Diabetes mellitus screening -     Glucose, random  Need for Tdap vaccination -     Tdap vaccine greater than or equal to 7yo IM  Need for influenza vaccination -     Flu Vaccine QUAD 36+ mos IM  Gastroesophageal reflux disease, esophagitis presence not specified  GERD precautions discussed. Continue Omeprazole 20 mg. F/U annually,before if needed.   Return in 1 year (on  04/24/2018), or if symptoms worsen or fail to improve, for CPE.    Betty G. [place]Jordan, MD  [facility]. [place] office.

## 2017-04-24 ENCOUNTER — Ambulatory Visit (INDEPENDENT_AMBULATORY_CARE_PROVIDER_SITE_OTHER): Payer: BLUE CROSS/BLUE SHIELD | Admitting: Family Medicine

## 2017-04-24 ENCOUNTER — Encounter: Payer: Self-pay | Admitting: Family Medicine

## 2017-04-24 VITALS — BP 118/78 | HR 70 | Resp 12 | Ht 67.5 in | Wt 210.4 lb

## 2017-04-24 DIAGNOSIS — Z131 Encounter for screening for diabetes mellitus: Secondary | ICD-10-CM

## 2017-04-24 DIAGNOSIS — Z13220 Encounter for screening for lipoid disorders: Secondary | ICD-10-CM | POA: Diagnosis not present

## 2017-04-24 DIAGNOSIS — K219 Gastro-esophageal reflux disease without esophagitis: Secondary | ICD-10-CM

## 2017-04-24 DIAGNOSIS — Z23 Encounter for immunization: Secondary | ICD-10-CM | POA: Diagnosis not present

## 2017-04-24 DIAGNOSIS — L309 Dermatitis, unspecified: Secondary | ICD-10-CM | POA: Diagnosis not present

## 2017-04-24 DIAGNOSIS — Z0001 Encounter for general adult medical examination with abnormal findings: Secondary | ICD-10-CM

## 2017-04-24 DIAGNOSIS — L02411 Cutaneous abscess of right axilla: Secondary | ICD-10-CM

## 2017-04-24 LAB — LIPID PANEL
CHOL/HDL RATIO: 6
CHOLESTEROL: 255 mg/dL — AB (ref 0–200)
HDL: 39.6 mg/dL (ref >39.00)
LDL Cholesterol: 195 mg/dL — ABNORMAL HIGH (ref 0–99)
NonHDL: 215.82
TRIGLYCERIDES: 102 mg/dL (ref 0.0–149.0)
VLDL: 20.4 mg/dL (ref 0.0–40.0)

## 2017-04-24 LAB — GLUCOSE, RANDOM: Glucose, Bld: 81 mg/dL (ref 70–99)

## 2017-04-24 MED ORDER — DOXYCYCLINE HYCLATE 100 MG PO TABS: 100.0000 mg | ORAL_TABLET | Freq: Two times a day (BID) | ORAL | 0 refills | Status: AC

## 2017-04-24 MED ORDER — TRIAMCINOLONE ACETONIDE 0.5 % EX OINT: 1.0000 "application " | TOPICAL_OINTMENT | Freq: Two times a day (BID) | CUTANEOUS | 1 refills | Status: DC

## 2017-04-24 NOTE — Patient Instructions (Addendum)
A few things to remember from today's visit:   Screening for lipid disorders - Plan: Lipid panel  Abscess of right axilla  Dermatitis, unspecified - Plan: triamcinolone ointment (KENALOG) 0.5 %  Diabetes mellitus screening - Plan: Glucose, random, doxycycline (VIBRA-TABS) 100 MG tablet  Encounter for general adult medical examination with abnormal findings  Warm compresses and start antibiotic.   At least 150 minutes of moderate exercise per week, daily brisk walking for 15-30 min is a good exercise option. Healthy diet low in saturated (animal) fats and sweets and consisting of fresh fruits and vegetables, lean meats such as fish and white chicken and whole grains.  - Vaccines:  Tdap vaccine every 10 years.  Shingles vaccine recommended at age 32, could be given after 32 years of age but not sure about insurance coverage.  Pneumonia vaccines:  Prevnar 13 at 65 and Pneumovax at 2266.   -Screening recommendations for low/normal risk males:  Screening for diabetes at age 32-45 and every 3 years.    Lipid screening at 35 and every 3 years.   Colonoscopy for colon cancer screening at age 32 and until age 32.  Prostate cancer screening: some controversy.        Please be sure medication list is accurate. If a new problem present, please set up appointment sooner than planned today.

## 2017-04-27 MED ORDER — OMEPRAZOLE 20 MG PO CPDR: 20.0000 mg | DELAYED_RELEASE_CAPSULE | Freq: Every day | ORAL | 2 refills | Status: DC

## 2017-05-15 ENCOUNTER — Ambulatory Visit (INDEPENDENT_AMBULATORY_CARE_PROVIDER_SITE_OTHER): Payer: BLUE CROSS/BLUE SHIELD | Admitting: Pulmonary Disease

## 2017-05-15 ENCOUNTER — Encounter: Payer: Self-pay | Admitting: Pulmonary Disease

## 2017-05-15 VITALS — BP 116/74 | HR 63 | Ht 67.5 in | Wt 210.0 lb

## 2017-05-15 DIAGNOSIS — G4733 Obstructive sleep apnea (adult) (pediatric): Secondary | ICD-10-CM | POA: Diagnosis not present

## 2017-05-15 NOTE — Progress Notes (Signed)
Subjective:    Patient ID: Jonathan Huff    DOB: [birth year], 31 y.o.   MRN: [id number]  HPI Chief Complaint  Patient presents with  . Sleep Consult    Referred by Dr. Swaziland for possible OSA. States that he feels groggy in the mornings. Has been told that he tends to snore loudly at night by family members. Dad has a history of OSA.      31 year old African-American man presents for evaluation of sleep-disordered breathing. His father noted on a recent trip that he stop breathing in his sleep. His wife had noted loud snoring but not witnessed apneas. He reports excessive daytime fatigue but denies somnolence. Epworth sleepiness score is 6 and a report sleepiness while reading but denies any problems driving. Bedtime is between 10 PM and midnight, sleep latency is between 10-30 minutes, he generally sleeps on his back but and soreness side with one pillow, reports 3-4 nocturnal awakenings including nocturia and is out of bed by 6:20 AM feeling tired with occasional dryness of mouth but denies a headache . His recent sleep habits have changed because he has a 9-week-old newborn son  There is no history suggestive of cataplexy, sleep paralysis or parasomnias He is gained about 20 pounds in the last 2 years. He works as a Automotive engineer of a heavy Nurse, learning disability   Past Medical History:  Diagnosis Date  . GERD (gastroesophageal reflux disease)     Past Surgical History:  Procedure Laterality Date  . FRACTURE SURGERY       No Known Allergies   Social History   Social History  . Marital status: Married    Spouse name: N/A  . Number of children: N/A  . Years of education: N/A   Occupational History  . Not on file.   Social History Main Topics  . Smoking status: Current Some Day Smoker    Types: Cigars  . Smokeless tobacco: Never Used  . Alcohol use 2.4 oz/week    4 Cans of beer per week  . Drug use: No  . Sexual activity: Yes    Birth  control/ protection: Condom   Other Topics Concern  . Not on file   Social History Narrative  . No narrative on file      Family History  Problem Relation Age of Onset  . Hypertension Mother   . Diabetes Mother   . Hypertension Father   . Cancer Paternal Grandmother   . Pneumonia Paternal Grandfather       Review of Systems   Positive for acid heartburn and weight gain of 20 pounds   Constitutional: negative for anorexia, fevers and sweats  Eyes: negative for irritation, redness and visual disturbance  Ears, nose, mouth, throat, and face: negative for earaches, epistaxis, nasal congestion and sore throat  Respiratory: negative for cough, dyspnea on exertion, sputum and wheezing  Cardiovascular: negative for chest pain, dyspnea, lower extremity edema, orthopnea, palpitations and syncope  Gastrointestinal: negative for abdominal pain, constipation, diarrhea, melena, nausea and vomiting  Genitourinary:negative for dysuria, frequency and hematuria  Hematologic/lymphatic: negative for bleeding, easy bruising and lymphadenopathy  Musculoskeletal:negative for arthralgias, muscle weakness and stiff joints  Neurological: negative for coordination problems, gait problems, headaches and weakness  Endocrine: negative for diabetic symptoms including polydipsia, polyuria and weight loss     Objective:   Physical Exam   Gen. Pleasant, well-nourished, in no distress,dreadlocks ENT - no thrush, no post nasal drip,Class II airway Neck: No  JVD, no thyromegaly, no carotid bruits Lungs: no use of accessory muscles, no dullness to percussion, clear without rales or rhonchi  Cardiovascular: Rhythm regular, heart sounds  normal, no murmurs or gallops, no peripheral edema Musculoskeletal: No deformities, no cyanosis or clubbing         Assessment & Plan:

## 2017-05-15 NOTE — Patient Instructions (Signed)
Schedule home sleep study. We discussed treatment options 

## 2017-05-15 NOTE — Assessment & Plan Note (Signed)
Given excessive daytime somnolence, narrow pharyngeal exam, witnessed apneas & loud snoring, obstructive sleep apnea is very likely & an overnight polysomnogram will be scheduled as a home study. The pathophysiology of obstructive sleep apnea , it's cardiovascular consequences & modes of treatment including CPAP were discused with the patient in detail & they evidenced understanding.  Pretest probability is intermediate  

## 2017-06-07 ENCOUNTER — Ambulatory Visit (INDEPENDENT_AMBULATORY_CARE_PROVIDER_SITE_OTHER): Payer: BLUE CROSS/BLUE SHIELD | Admitting: Family Medicine

## 2017-06-07 VITALS — BP 98/80 | HR 58 | Temp 98.2°F | Wt 207.6 lb

## 2017-06-07 DIAGNOSIS — R109 Unspecified abdominal pain: Secondary | ICD-10-CM

## 2017-06-07 LAB — POC URINALSYSI DIPSTICK (AUTOMATED)
Bilirubin, UA: NEGATIVE
GLUCOSE UA: NEGATIVE
Ketones, UA: NEGATIVE
Leukocytes, UA: NEGATIVE
Nitrite, UA: NEGATIVE
Protein, UA: NEGATIVE
RBC UA: NEGATIVE
SPEC GRAV UA: 1.025 (ref 1.010–1.025)
UROBILINOGEN UA: 0.2 U/dL
pH, UA: 6 (ref 5.0–8.0)

## 2017-06-07 NOTE — Progress Notes (Signed)
Subjective:    Patient ID: Jonathan RoutGerald L Schwier Jr., male    DOB: 1986/03/28, 31 y.o.   MRN: [id number]  Chief Complaint  Patient presents with  . Flank Pain    sx started friday     HPI Patient was seen today for acute concern of "pain in kidneys".  Pt endorses b/l flank/back pain since Friday.  More of a discomfort rather than a pain, 3-4/10.  When laying in bed at night the feeling is a 5/10.  Pt has tried ibuprofen.  Pain noticed more in the am and at night, but not when up moving around.  Pt drinks ~ 32 oz of water per day.  Pt endorses drinking several glasses of Bourbon over the weekend and 2 drinks with dinner.  Pt denies fever, chills, pain w/ urination, hematuria, decreased stream.  Past Medical History:  Diagnosis Date  . GERD (gastroesophageal reflux disease)     No Known Allergies  ROS General: Denies fever, chills, night sweats, changes in weight, changes in appetite HEENT: Denies headaches, ear pain, changes in vision, rhinorrhea, sore throat CV: Denies CP, palpitations, SOB, orthopnea Pulm: Denies SOB, cough, wheezing GI: Denies abdominal pain, nausea, vomiting, diarrhea, constipation GU: Denies dysuria, hematuria, frequency, vaginal discharge Msk: Denies muscle cramps, joint pains  +b/l flank/back pain Neuro: Denies weakness, numbness, tingling Skin: Denies rashes, bruising Psych: Denies depression, anxiety, hallucinations     Objective:    Blood pressure 98/80, pulse (!) 58, temperature 98.2 F (36.8 C), temperature source Oral, weight 207 lb 9.6 oz (94.2 kg).   Gen. Pleasant, well-nourished, in no distress, normal affect  HEENT: [place]/AT, face symmetric, no scleral icterus, PERRLA, EOMI, nares patent without drainage, pharynx without erythema or exudate. Lungs: no accessory muscle use, CTAB, no wheezes or rales Cardiovascular: RRR, no m/r/g, no peripheral edema Abdomen: BS present, soft, NT/ND, no hepatosplenomegaly. Musculoskeletal: No deformities, no cyanosis  or clubbing, normal tone.  No TTP of spine, paraspinal muscles.  Point tenderness below L lateral costal margin.  No CVA tenderness. Neuro:  A&Ox3, CN II-XII intact, normal gait Skin:  Warm, no lesions/ rash GU: urine sample obtained.   Wt Readings from Last 3 Encounters:  06/07/17 207 lb 9.6 oz (94.2 kg)  05/15/17 210 lb (95.3 kg)  04/24/17 210 lb 6 oz (95.4 kg)    Lab Results  Component Value Date   WBC 4.3 01/17/2017   HGB 15.0 01/17/2017   HCT 46.0 01/17/2017   PLT 255.0 01/17/2017   GLUCOSE 81 04/24/2017   CHOL 255 (H) 04/24/2017   TRIG 102.0 04/24/2017   HDL 39.60 04/24/2017   LDLCALC 195 (H) 04/24/2017   NA 139 01/17/2017   K 4.4 01/17/2017   CL 103 01/17/2017   CREATININE 0.98 01/17/2017   BUN 10 01/17/2017   CO2 28 01/17/2017   TSH 1.56 01/17/2017    Assessment/Plan:  Flank pain  -SG 1.020.  Will send for UCx -discussed increasing po intake of water -discussed decreasing EtOH intake -Given RTC precuations: fever, chills, increasing pain, hematuria, pain that moves, etc. - Plan: POCT Urinalysis Dipstick (Automated), Urine Culture  F/u prn.

## 2017-06-07 NOTE — Patient Instructions (Addendum)
Flank Pain Flank pain is pain in your side. The flank is the area of your side between your upper belly (abdomen) and your back. The pain may occur over a short period of time (acute) or may be long-term or come back often (chronic). It may be mild or very bad. Pain in this area can be caused by many different things. Follow these instructions at home:  Rest as told by your doctor.  Drink enough fluid to keep your pee (urine) clear or pale yellow.  Take over-the-counter and prescription medicines only as told by your doctor.  Keep all follow-up visits as told by your doctor. This is important. Contact a doctor if:  Medicine does not help your pain.  You have new symptoms.  Your pain gets worse.  You have a fever.  Your symptoms last longer than 2-3 days. Get help right away if:  Your tummy hurts or is swollen.  You are short of breath.  You feel sick to your stomach (nauseous) and it does not go away.  You cannot stop throwing up (vomiting).  You feel like you will pass out or you do pass out (faint).  You have blood in your pee.  You have a fever and your symptoms suddenly get worse. This information is not intended to replace advice given to you by your health care provider. Make sure you discuss any questions you have with your health care provider. Document Released: 05/09/2008 Document Revised: 04/21/2016 Document Reviewed: 05/04/2015 Elsevier Interactive Patient Education  2018 Elsevier Inc.  

## 2017-06-09 LAB — URINE CULTURE
MICRO NUMBER:: 81196884
SPECIMEN QUALITY:: ADEQUATE

## 2017-06-12 ENCOUNTER — Other Ambulatory Visit: Payer: Self-pay | Admitting: Family Medicine

## 2017-06-12 MED ORDER — SULFAMETHOXAZOLE-TRIMETHOPRIM 800-160 MG PO TABS: 1.0000 | ORAL_TABLET | Freq: Two times a day (BID) | ORAL | 0 refills | Status: DC

## 2017-06-14 DIAGNOSIS — G4710 Hypersomnia, unspecified: Secondary | ICD-10-CM | POA: Diagnosis not present

## 2017-06-15 DIAGNOSIS — G4710 Hypersomnia, unspecified: Secondary | ICD-10-CM | POA: Diagnosis not present

## 2017-06-20 ENCOUNTER — Telehealth: Payer: Self-pay | Admitting: Pulmonary Disease

## 2017-06-20 NOTE — Telephone Encounter (Signed)
Per RA, HST did not show any OSA. Does not recommend any treatment.

## 2017-06-22 ENCOUNTER — Other Ambulatory Visit: Payer: Self-pay | Admitting: *Deleted

## 2017-06-22 DIAGNOSIS — G4733 Obstructive sleep apnea (adult) (pediatric): Secondary | ICD-10-CM

## 2017-06-27 NOTE — Telephone Encounter (Signed)
Spoke with patient. He is aware of results. Nothing further needed at time of call.  

## 2017-07-02 ENCOUNTER — Encounter: Payer: Self-pay | Admitting: Pulmonary Disease

## 2017-07-02 ENCOUNTER — Encounter: Payer: Self-pay | Admitting: Family Medicine

## 2017-07-03 NOTE — Telephone Encounter (Signed)
Snoring by itself is difficult to treat It could be arising from the throat or from the nostrils  Suggest -trial of nasal steroids such as Nasonex 1 spray each nare at bedtime. Other alternatives include devices such as provent , Mute device or NStent -he can check these out online

## 2017-07-16 ENCOUNTER — Other Ambulatory Visit: Payer: Self-pay | Admitting: Family Medicine

## 2017-07-19 ENCOUNTER — Telehealth: Payer: Self-pay | Admitting: Emergency Medicine

## 2017-07-19 ENCOUNTER — Ambulatory Visit: Payer: Self-pay | Admitting: Family Medicine

## 2017-07-19 NOTE — Telephone Encounter (Signed)
Spoke with patient regarding the appointment that he set up through my chart; per Dr. Salomon FickBanks that time slot is unavaiable but there is another provider in the office that has two time slots where patient can be seen. Patient states that he can not make either of those slots and would like to call back next week and schedule an appointment with his provider.

## 2017-08-02 ENCOUNTER — Encounter: Payer: Self-pay | Admitting: Pulmonary Disease

## 2017-08-02 NOTE — Telephone Encounter (Signed)
RA - please advise. Thanks! 

## 2017-08-02 NOTE — Telephone Encounter (Signed)
OTC Nasonex okay

## 2017-12-08 ENCOUNTER — Other Ambulatory Visit: Payer: Self-pay | Admitting: Family Medicine

## 2017-12-08 DIAGNOSIS — K219 Gastro-esophageal reflux disease without esophagitis: Secondary | ICD-10-CM

## 2018-01-03 ENCOUNTER — Encounter: Payer: Self-pay | Admitting: Family Medicine

## 2018-01-03 ENCOUNTER — Ambulatory Visit: Payer: BLUE CROSS/BLUE SHIELD | Admitting: Family Medicine

## 2018-01-03 ENCOUNTER — Ambulatory Visit (INDEPENDENT_AMBULATORY_CARE_PROVIDER_SITE_OTHER)
Admission: RE | Admit: 2018-01-03 | Discharge: 2018-01-03 | Disposition: A | Discharge status: Home / Self Care | Payer: BLUE CROSS/BLUE SHIELD | Source: Ambulatory Visit | Attending: Family Medicine | Admitting: Family Medicine

## 2018-01-03 VITALS — BP 126/82 | HR 64 | Temp 98.0°F | Resp 12 | Ht 67.5 in | Wt 203.5 lb

## 2018-01-03 DIAGNOSIS — R1011 Right upper quadrant pain: Secondary | ICD-10-CM

## 2018-01-03 DIAGNOSIS — R200 Anesthesia of skin: Secondary | ICD-10-CM | POA: Diagnosis not present

## 2018-01-03 DIAGNOSIS — M545 Low back pain: Secondary | ICD-10-CM | POA: Diagnosis not present

## 2018-01-03 DIAGNOSIS — R198 Other specified symptoms and signs involving the digestive system and abdomen: Secondary | ICD-10-CM

## 2018-01-03 DIAGNOSIS — M549 Dorsalgia, unspecified: Secondary | ICD-10-CM | POA: Diagnosis not present

## 2018-01-03 DIAGNOSIS — R202 Paresthesia of skin: Secondary | ICD-10-CM

## 2018-01-03 DIAGNOSIS — M546 Pain in thoracic spine: Secondary | ICD-10-CM | POA: Diagnosis not present

## 2018-01-03 LAB — BASIC METABOLIC PANEL
BUN: 13 mg/dL (ref 6–23)
CHLORIDE: 102 meq/L (ref 96–112)
CO2: 28 meq/L (ref 19–32)
Calcium: 9.5 mg/dL (ref 8.4–10.5)
Creatinine, Ser: 1.01 mg/dL (ref 0.40–1.50)
GFR: 109.3 mL/min (ref >60.00)
Glucose, Bld: 87 mg/dL (ref 70–99)
Potassium: 4.4 mEq/L (ref 3.5–5.1)
SODIUM: 137 meq/L (ref 135–145)

## 2018-01-03 LAB — VITAMIN B12: Vitamin B-12: 383 pg/mL (ref 211–911)

## 2018-01-03 LAB — CBC
HEMATOCRIT: 44.6 % (ref 39.0–52.0)
Hemoglobin: 14.4 g/dL (ref 13.0–17.0)
MCHC: 32.4 g/dL (ref 30.0–36.0)
MCV: 71.7 fl — AB (ref 78.0–100.0)
PLATELETS: 255 10*3/uL (ref 150.0–400.0)
RBC: 6.21 Mil/uL — ABNORMAL HIGH (ref 4.22–5.81)
RDW: 15.2 % (ref 11.5–15.5)
WBC: 4 10*3/uL (ref 4.0–10.5)

## 2018-01-03 LAB — POC URINALSYSI DIPSTICK (AUTOMATED)
Bilirubin, UA: NEGATIVE
Blood, UA: NEGATIVE
Glucose, UA: NEGATIVE
Ketones, UA: NEGATIVE
LEUKOCYTES UA: NEGATIVE
NITRITE UA: NEGATIVE
PH UA: 6 (ref 5.0–8.0)
Protein, UA: NEGATIVE
Spec Grav, UA: 1.025 (ref 1.010–1.025)
UROBILINOGEN UA: 0.2 U/dL

## 2018-01-03 MED ORDER — PREDNISONE 20 MG PO TABS: ORAL_TABLET | ORAL | 0 refills | Status: AC

## 2018-01-03 NOTE — Progress Notes (Signed)
HPI:   Jonathan Huff. is a 32 y.o. male, who is here today complaining of side pain, intermittently, occasional he has burning sensation on same area. He thinks this may be a UTI. Pain is on waist area R>>L, sometimes RUQ burning pain. Dull intermittent ,occasionally sharp. Pain is not related to food intake.  Right-sided pain is exacerbated by lying on right,interferring with sleep. Alleviated by lying on his back. Most of the time 3/10 but sometimes 7/10. No Hx of back pain or recent trauma.  He has also noted that pain is exacerbated by alcohol intake. A couple days ago he has some tingling of one of his legs, he does not remember which one. Denies fever,chills, changes in appetite, dysuria, gross hematuria,or decreased urine output.   Denies dysuria, gross hematuria,or decreased urine output. He has some urine frequency.   Denies nausea, vomiting, changes in bowel habits, blood in stool or melena.  OTC medications for this problem: None    Review of Systems  Constitutional: Negative for activity change, appetite change, fatigue and fever.  HENT: Negative for mouth sores and trouble swallowing.   Cardiovascular: Negative for palpitations and leg swelling.  Gastrointestinal: Negative for abdominal pain, nausea and vomiting.       No changes in bowel habits.  Endocrine: Negative for cold intolerance and heat intolerance.  Genitourinary: Positive for flank pain and frequency. Negative for decreased urine volume, discharge, dysuria, hematuria, scrotal swelling and urgency.  Musculoskeletal: Positive for back pain. Negative for myalgias.  Skin: Negative for rash.  Neurological: Negative for syncope, weakness and headaches.  Hematological: Negative for adenopathy. Does not bruise/bleed easily.  Psychiatric/Behavioral: Positive for sleep disturbance. Negative for confusion.      Current Outpatient Medications on File Prior to Visit  Medication Sig Dispense  Refill  . omeprazole (PRILOSEC) 20 MG capsule Take 1 capsule (20 mg total) by mouth daily before breakfast. 90 capsule 2   No current facility-administered medications on file prior to visit.      Past Medical History:  Diagnosis Date  . GERD (gastroesophageal reflux disease)    No Known Allergies  Social History   Socioeconomic History  . Marital status: Married    Spouse name: Not on file  . Number of children: Not on file  . Years of education: Not on file  . Highest education level: Not on file  Occupational History  . Not on file  Social Needs  . Financial resource strain: Not on file  . Food insecurity:    Worry: Not on file    Inability: Not on file  . Transportation needs:    Medical: Not on file    Non-medical: Not on file  Tobacco Use  . Smoking status: Current Some Day Smoker    Types: Cigars  . Smokeless tobacco: Never Used  Substance and Sexual Activity  . Alcohol use: Yes    Alcohol/week: 2.4 oz    Types: 4 Cans of beer per week  . Drug use: No  . Sexual activity: Yes    Birth control/protection: Condom  Lifestyle  . Physical activity:    Days per week: Not on file    Minutes per session: Not on file  . Stress: Not on file  Relationships  . Social connections:    Talks on phone: Not on file    Gets together: Not on file    Attends religious service: Not on file    Active member of  club or organization: Not on file    Attends meetings of clubs or organizations: Not on file    Relationship status: Not on file  Other Topics Concern  . Not on file  Social History Narrative  . Not on file    Vitals:   01/03/18 0714  BP: 126/82  Pulse: 64  Resp: 12  Temp: 98 F (36.7 C)  SpO2: 98%   Body mass index is 31.4 kg/m.   Physical Exam  Nursing note and vitals reviewed. Constitutional: He is oriented to person, place, and time. He appears well-developed. No distress.  HENT:  Head: Normocephalic and atraumatic.  Mouth/Throat: Oropharynx is  clear and moist and mucous membranes are normal.  Eyes: Conjunctivae and EOM are normal.  Cardiovascular: Normal rate and regular rhythm.  No murmur heard. Respiratory: Effort normal and breath sounds normal. No respiratory distress.  GI: Soft. He exhibits no mass. There is no hepatomegaly. There is no tenderness. There is no CVA tenderness.  Musculoskeletal: He exhibits no edema.       Back:  No significant deformity appreciated. Mild tenderness upon palpation of low thoracic and lumbar paraspinal muscles. No local edema or erythema appreciated, no suspicious lesions.   Lymphadenopathy:    He has no cervical adenopathy.  Neurological: He is alert and oriented to person, place, and time. He has normal strength. Gait normal.  Reflex Scores:      Patellar reflexes are 2+ on the right side and 2+ on the left side. Skin: Skin is warm. No rash noted. No erythema.  Psychiatric: He has a normal mood and affect.  Well groomed,good eye contact.      ASSESSMENT AND PLAN:   Jonathan Huff was seen today for right-sided pain.  Diagnoses and all orders for this visit:  Lab Results  Component Value Date   CREATININE 1.01 01/03/2018   BUN 13 01/03/2018   NA 137 01/03/2018   K 4.4 01/03/2018   CL 102 01/03/2018   CO2 28 01/03/2018   Lab Results  Component Value Date   VITAMINB12 383 01/03/2018   Lab Results  Component Value Date   WBC 4.0 01/03/2018   HGB 14.4 01/03/2018   HCT 44.6 01/03/2018   MCV 71.7 (L) 01/03/2018   PLT 255.0 01/03/2018    Bilateral back pain, unspecified back location, unspecified chronicity  I do not think pain is related to UTI. Urine dipstick today negative. It seems musculoskeletal pain. He is concerned about his kidney function and would like to have it tested.  -     POCT Urinalysis Dipstick (Automated)  Numbness and tingling of leg  Possible causes discussed. Instructed about warning signs. Further recommendations will be given according to  labs/imaging results.  -     Vitamin B12 -     predniSONE (DELTASONE) 20 MG tablet; 3 tabs for 3 days, 2 tabs for 3 days, 1 tabs for 3 days, and 1/2 tab for 3 days. Take tables together with breakfast.  Abdominal burning sensation in right upper quadrant  We discussed possible etiologies, question of radicular pain. Symptoms and physical findings do not support UTI.  After discussion of some side effects and benefits versus risk, he agrees with redness on taper. If pain/burning is persistent in 4 weeks we will consider thoracic/lumbar MRI.  -     DG Lumbar Spine Complete; Future -     DG Thoracic Spine W/Swimmers; Future -     Basic metabolic panel -  CBC -     predniSONE (DELTASONE) 20 MG tablet; 3 tabs for 3 days, 2 tabs for 3 days, 1 tabs for 3 days, and 1/2 tab for 3 days. Take tables together with breakfast.      Betty G. Swaziland, MD  [facility] [place] office.

## 2018-01-03 NOTE — Patient Instructions (Signed)
A few things to remember from today's visit:   Bilateral back pain, unspecified back location, unspecified chronicity - Plan: POCT Urinalysis Dipstick (Automated)  Numbness and tingling of leg - Plan: Vitamin B12, predniSONE (DELTASONE) 20 MG tablet  Abdominal burning sensation in right upper quadrant - Plan: DG Lumbar Spine Complete, DG Thoracic Spine W/Swimmers, Basic metabolic panel, CBC, predniSONE (DELTASONE) 20 MG tablet  If persistent symptoms in 4 weeks we will consider MRI.  Today X ray was ordered.  This can be done at [facility] between 8 am and 5 pm: [address] [place]. [phone].   Please be sure medication list is accurate. If a new problem present, please set up appointment sooner than planned today.

## 2018-01-05 ENCOUNTER — Encounter: Payer: Self-pay | Admitting: Family Medicine

## 2018-01-06 ENCOUNTER — Encounter: Payer: Self-pay | Admitting: Family Medicine

## 2018-02-20 ENCOUNTER — Encounter: Payer: Self-pay | Admitting: Family Medicine

## 2018-03-27 ENCOUNTER — Ambulatory Visit: Payer: BLUE CROSS/BLUE SHIELD | Admitting: Family Medicine

## 2018-03-27 ENCOUNTER — Encounter: Payer: Self-pay | Admitting: Family Medicine

## 2018-03-27 VITALS — BP 100/70 | Temp 98.6°F | Wt 205.0 lb

## 2018-03-27 DIAGNOSIS — R350 Frequency of micturition: Secondary | ICD-10-CM

## 2018-03-27 DIAGNOSIS — R1011 Right upper quadrant pain: Secondary | ICD-10-CM | POA: Diagnosis not present

## 2018-03-27 LAB — POC URINALSYSI DIPSTICK (AUTOMATED)
Bilirubin, UA: NEGATIVE
Glucose, UA: NEGATIVE
KETONES UA: NEGATIVE
Leukocytes, UA: NEGATIVE
Nitrite, UA: NEGATIVE
PH UA: 6 (ref 5.0–8.0)
PROTEIN UA: NEGATIVE
RBC UA: NEGATIVE
SPEC GRAV UA: 1.015 (ref 1.010–1.025)
UROBILINOGEN UA: 0.2 U/dL

## 2018-03-27 MED ORDER — OMEPRAZOLE 40 MG PO CPDR: 40.0000 mg | DELAYED_RELEASE_CAPSULE | Freq: Two times a day (BID) | ORAL | 0 refills | Status: DC

## 2018-03-27 NOTE — Progress Notes (Signed)
   Subjective:    Patient ID: Jonathan RoutGerald L Coate Jr., male    DOB: [birth year], 32 y.o.   MRN: [id number]  HPI Here for longstanding intermittent pains in the right flank and RUQ. Eating food does not affect this, but drinking alcohol does make the pain worse. It may last for several hours and then disappear for a day of too. No nausea or fever. No urinary symptoms. BMs are regular.    Review of Systems  Constitutional: Negative.   Respiratory: Negative.   Cardiovascular: Negative.   Gastrointestinal: Positive for abdominal pain. Negative for abdominal distention, anal bleeding, blood in stool, constipation, diarrhea, nausea, rectal pain and vomiting.  Genitourinary: Negative.   Musculoskeletal: Positive for back pain.       Objective:   Physical Exam  Constitutional: He appears well-developed and well-nourished. No distress.  Cardiovascular: Normal rate, regular rhythm, normal heart sounds and intact distal pulses.  Pulmonary/Chest: Effort normal and breath sounds normal.  Abdominal: Soft. Bowel sounds are normal. He exhibits no distension and no mass. There is no rebound and no guarding. No hernia.  Mildly tender in the RUQ           Assessment & Plan:  RUQ pain consistent with either duodenitis/ulcer or gall bladder disease. We will set up an abdominal US soon. He will start on Omeprazole 40 mg bid.  Gershon CraneStephen Neelie Welshans, MD

## 2018-04-02 ENCOUNTER — Encounter: Payer: Self-pay | Admitting: Family Medicine

## 2018-04-03 NOTE — Telephone Encounter (Signed)
The UA was clear which means he does not have any infection. His abdominal US is still pending.

## 2018-04-18 ENCOUNTER — Ambulatory Visit
Admission: RE | Admit: 2018-04-18 | Discharge: 2018-04-18 | Disposition: A | Discharge status: Home / Self Care | Payer: BLUE CROSS/BLUE SHIELD | Source: Ambulatory Visit | Attending: Family Medicine | Admitting: Family Medicine

## 2018-04-18 DIAGNOSIS — R1011 Right upper quadrant pain: Secondary | ICD-10-CM | POA: Diagnosis not present

## 2018-04-24 ENCOUNTER — Telehealth: Payer: Self-pay | Admitting: *Deleted

## 2018-04-24 NOTE — Telephone Encounter (Signed)
-----   Message from Nelwyn Salisbury, MD sent at 04/19/2018  1:17 PM EDT ----- Normal gall bladder

## 2018-04-24 NOTE — Telephone Encounter (Signed)
Result note read to patient. States he reviewed the [place] results and has questions r/t .Marland KitchenMarland Kitchen Impression: "Apparent calcified splenic granulomas consistent with prior granulomatous disease." Please advise: [phone]  Dr. Clent Ridges please advise.  Thanks

## 2018-04-24 NOTE — Telephone Encounter (Signed)
Tell him this shows evidence of a prior infection process in the past but I cannot say what kind. He should follow up with Dr. Swaziland to see if she wants to do any further testing

## 2018-04-25 NOTE — Telephone Encounter (Signed)
Pt given results per notes of Dr Clent RidgesFry on 9/11/19Unable to document in result note due to [facility]   Pt wishing to make appointment for discussion if their needs to be further testing.  Pt is requesting an appointment with Dr Clent RidgesFry. Pt stated he wants to switch PCP to Dr Clent RidgesFry.

## 2018-04-25 NOTE — Telephone Encounter (Signed)
[id number] x1 

## 2018-04-25 NOTE — Telephone Encounter (Signed)
[facility]  He would like a provider that is available on Thursdays since that is his day off.  Dr SwazilandJordan please advise.

## 2018-04-26 NOTE — Telephone Encounter (Signed)
It is fine with me. Thanks, BJ 

## 2018-04-29 ENCOUNTER — Other Ambulatory Visit: Payer: Self-pay | Admitting: Family Medicine

## 2018-05-01 NOTE — Telephone Encounter (Signed)
That is fine 

## 2018-05-01 NOTE — Telephone Encounter (Signed)
Cory please advise if you will accept this patient.

## 2018-05-23 ENCOUNTER — Encounter: Payer: Self-pay | Admitting: Family Medicine

## 2018-05-23 NOTE — Telephone Encounter (Signed)
Dr. Fry please advise. Thanks  

## 2018-05-24 NOTE — Telephone Encounter (Signed)
Have him follow up with Dr. Swaziland

## 2018-05-26 ENCOUNTER — Other Ambulatory Visit: Payer: Self-pay | Admitting: Family Medicine

## 2018-06-11 ENCOUNTER — Other Ambulatory Visit: Payer: Self-pay | Admitting: Family Medicine

## 2018-06-14 ENCOUNTER — Encounter (INDEPENDENT_AMBULATORY_CARE_PROVIDER_SITE_OTHER): Payer: Self-pay | Admitting: Family Medicine

## 2018-06-14 ENCOUNTER — Ambulatory Visit (INDEPENDENT_AMBULATORY_CARE_PROVIDER_SITE_OTHER): Payer: BLUE CROSS/BLUE SHIELD | Admitting: Family Medicine

## 2018-06-14 VITALS — BP 107/68 | HR 54 | Temp 98.5°F | Ht 67.75 in | Wt 199.2 lb

## 2018-06-14 DIAGNOSIS — R109 Unspecified abdominal pain: Secondary | ICD-10-CM

## 2018-06-14 MED ORDER — OMEPRAZOLE 40 MG PO CPDR: 40.0000 mg | DELAYED_RELEASE_CAPSULE | Freq: Every day | ORAL | 1 refills | Status: DC

## 2018-06-14 NOTE — Progress Notes (Signed)
   Office Visit Note   Patient: Jonathan Huff.           Date of Birth: Jun 02, 1986           MRN: [id number] Visit Date: 06/14/2018 Requested by: [place], Betty G, MD [address] [place], [place] [address] PCP: [place], Betty G, MD  Subjective: Chief Complaint  Patient presents with  . right flank pain/occ groin pain x 1 year    HPI: He is a 32 year old with right side pain.  Symptoms started about 1 year ago, no injury.  Pain on the posterior lateral lower rib cage area.  He was evaluated with ultrasound to rule out appendicitis, it showed calcific granulomas in the spleen but no other abnormality.  He had some labs which were unrevealing.  Urinalysis was normal.  His symptoms are worse when his bladder is full but he has no major change in symptoms with urination or bowel movements.  Certain foods seem to aggravate his pain, including alcohol and coffee.  He is currently on omeprazole with some improvement.              ROS: He is otherwise in good health, all other systems were negative.  Objective: Vital Signs: BP 107/68 (BP Location: Left Arm, Patient Position: Sitting, Cuff Size: Normal)   Pulse (!) 54   Temp 98.5 F (36.9 C)   Ht 5' 7.75" (1.721 m)   Wt 199 lb 4 oz (90.4 kg)   BMI 30.52 kg/m   Physical Exam:  Right side: No tenderness along the thoracolumbar spine, no scoliosis and good range of motion.  There is some tenderness along the right posterior lateral lower ribs.  No hepatosplenomegaly detectable.  Imaging: None today.  Assessment & Plan: 1.  Chronic right side back and abdominal pain, etiology uncertain. -We will draw labs to look at liver function and to check vitamin D level.  We will also order a CT scan of the abdomen and pelvis to further evaluate. -Consider physical therapy for myofascial release techniques depending on the findings.   Follow-Up Instructions: No follow-ups on file.       Procedures: None today.   PMFS  History: Patient Active Problem List   Diagnosis Date Noted  . OSA (obstructive sleep apnea) 05/15/2017  . Chronic pain of both knees 01/24/2017  . GERD (gastroesophageal reflux disease) 01/17/2017  . Class 1 obesity with body mass index (BMI) of 31.0 to 31.9 in adult 01/17/2017   Past Medical History:  Diagnosis Date  . GERD (gastroesophageal reflux disease)     Family History  Problem Relation Age of Onset  . Hypertension Mother   . Diabetes Mother   . Hypertension Father   . Cancer Paternal Grandmother   . Pneumonia Paternal Grandfather     Past Surgical History:  Procedure Laterality Date  . FRACTURE SURGERY     Social History   Occupational History  . Not on file  Tobacco Use  . Smoking status: Current Some Day Smoker    Types: Cigars  . Smokeless tobacco: Never Used  Substance and Sexual Activity  . Alcohol use: Yes    Alcohol/week: 4.0 standard drinks    Types: 4 Cans of beer per week  . Drug use: No  . Sexual activity: Yes    Birth control/protection: Condom

## 2018-06-15 ENCOUNTER — Telehealth (INDEPENDENT_AMBULATORY_CARE_PROVIDER_SITE_OTHER): Payer: Self-pay | Admitting: Family Medicine

## 2018-06-15 LAB — COMPREHENSIVE METABOLIC PANEL
AG Ratio: 1.5 (calc) (ref 1.0–2.5)
ALKALINE PHOSPHATASE (APISO): 63 U/L (ref 40–115)
ALT: 13 U/L (ref 9–46)
AST: 16 U/L (ref 10–40)
Albumin: 4.4 g/dL (ref 3.6–5.1)
BILIRUBIN TOTAL: 1.1 mg/dL (ref 0.2–1.2)
BUN: 14 mg/dL (ref 7–25)
CALCIUM: 9.8 mg/dL (ref 8.6–10.3)
CO2: 28 mmol/L (ref 20–32)
Chloride: 99 mmol/L (ref 98–110)
Creat: 1.12 mg/dL (ref 0.60–1.35)
Globulin: 2.9 g/dL (calc) (ref 1.9–3.7)
Glucose, Bld: 82 mg/dL (ref 65–99)
Potassium: 4.7 mmol/L (ref 3.5–5.3)
SODIUM: 136 mmol/L (ref 135–146)
Total Protein: 7.3 g/dL (ref 6.1–8.1)

## 2018-06-15 LAB — VITAMIN D 25 HYDROXY (VIT D DEFICIENCY, FRACTURES): VIT D 25 HYDROXY: 16 ng/mL — AB (ref 30–100)

## 2018-06-15 NOTE — Telephone Encounter (Signed)
Vitamin D is low at 16.

## 2018-06-20 ENCOUNTER — Other Ambulatory Visit: Payer: Self-pay

## 2018-06-21 ENCOUNTER — Encounter (INDEPENDENT_AMBULATORY_CARE_PROVIDER_SITE_OTHER): Payer: Self-pay | Admitting: Family Medicine

## 2018-09-18 DIAGNOSIS — J029 Acute pharyngitis, unspecified: Secondary | ICD-10-CM | POA: Diagnosis not present

## 2018-10-11 ENCOUNTER — Encounter (INDEPENDENT_AMBULATORY_CARE_PROVIDER_SITE_OTHER): Payer: Self-pay | Admitting: Family Medicine

## 2018-10-11 MED ORDER — OSELTAMIVIR PHOSPHATE 75 MG PO CAPS: 75.0000 mg | ORAL_CAPSULE | Freq: Every day | ORAL | 0 refills | Status: DC

## 2018-11-05 IMAGING — US US ABDOMEN COMPLETE
1 series · 13 of 25 positions shown · non-contrast
Comparison: Lumbar spine films of 01/03/2017

CLINICAL DATA: Intermittent right upper quadrant pain over the last
year

EXAM:
ABDOMEN ULTRASOUND COMPLETE

[Series 1: us abdomen complete · 0.22mm/px · 13 of 80 slices shown]
[im 1/80]
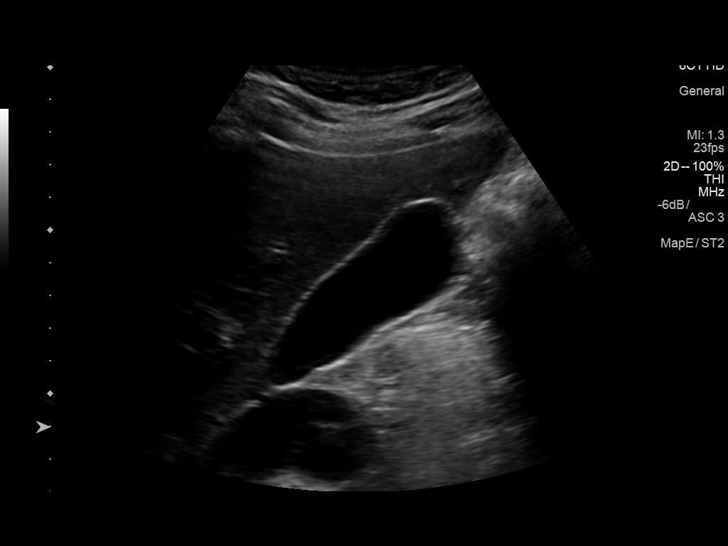
[im 7/80]
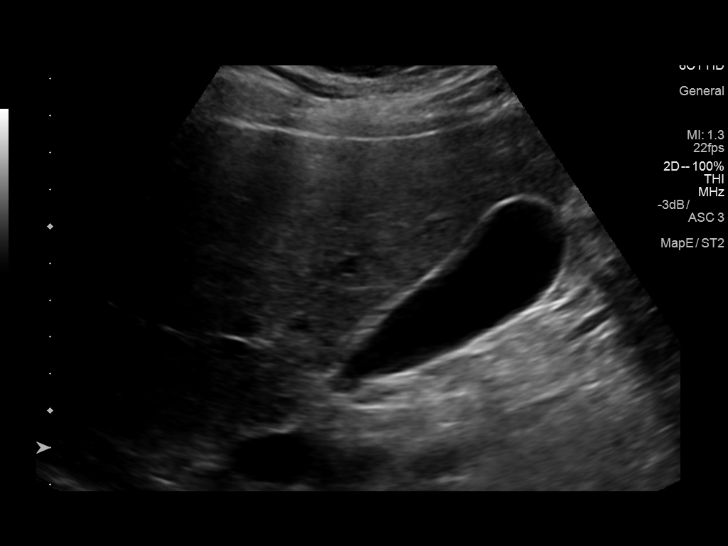
[im 14/80]
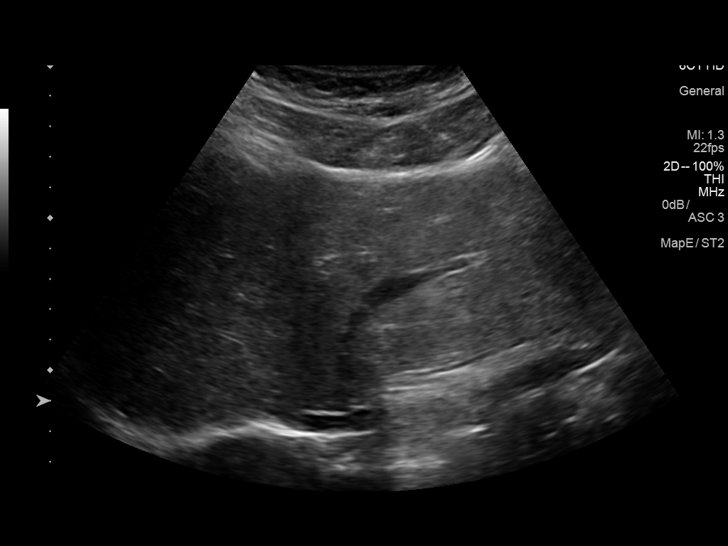
[im 20/80]
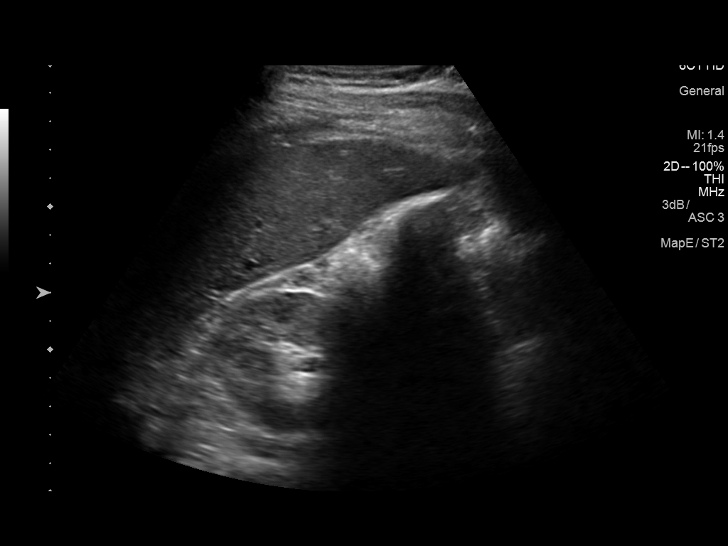
[im 27/80]
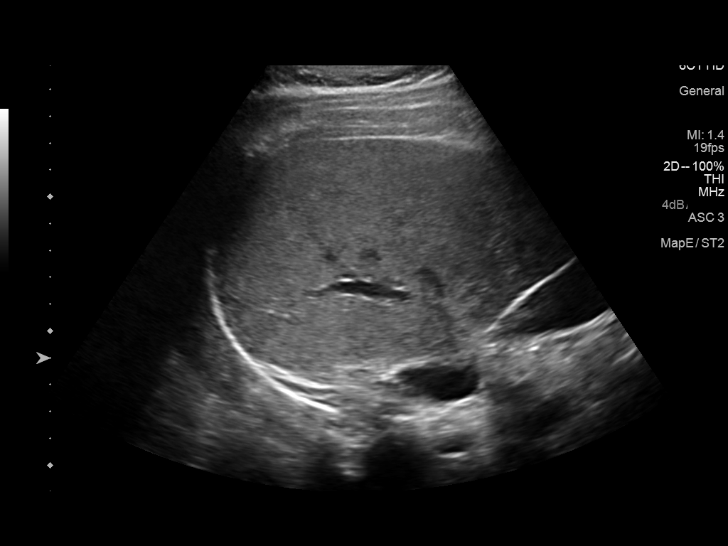
[im 33/80]
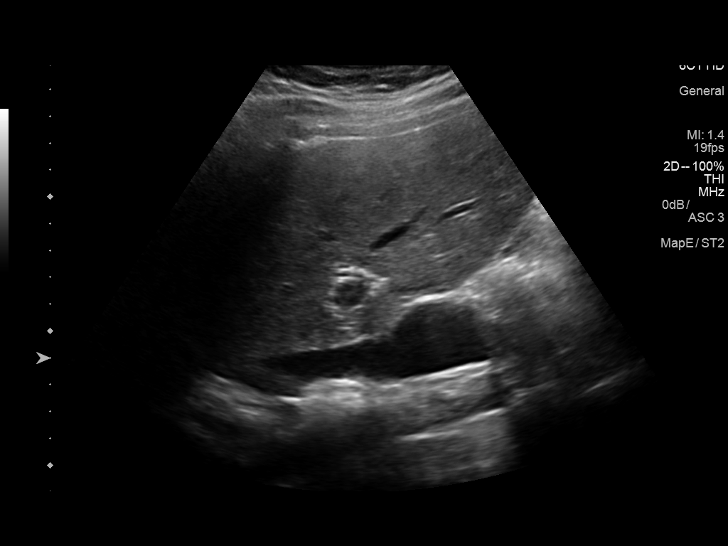
[im 40/80]
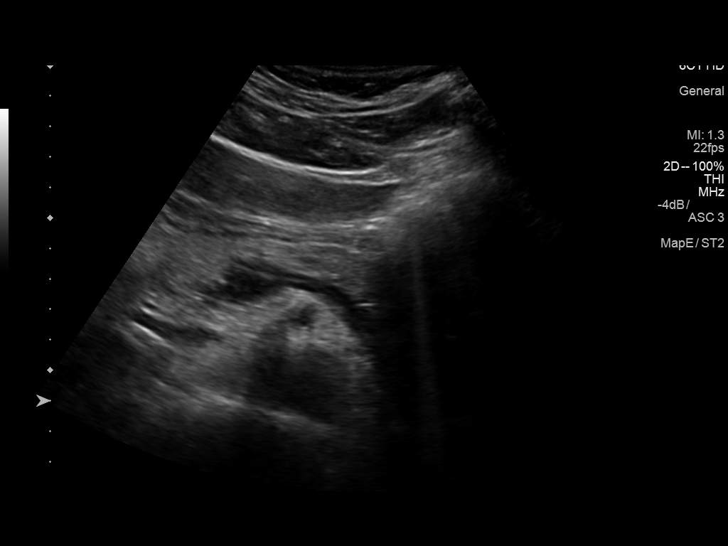
[im 47/80]
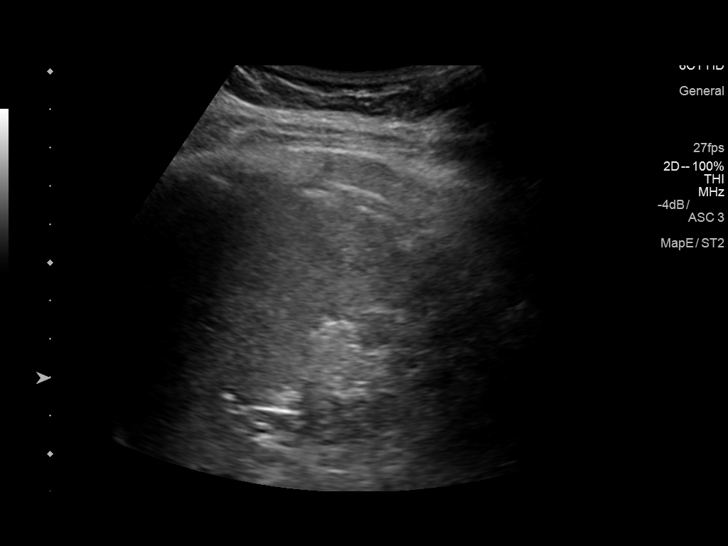
[im 53/80]
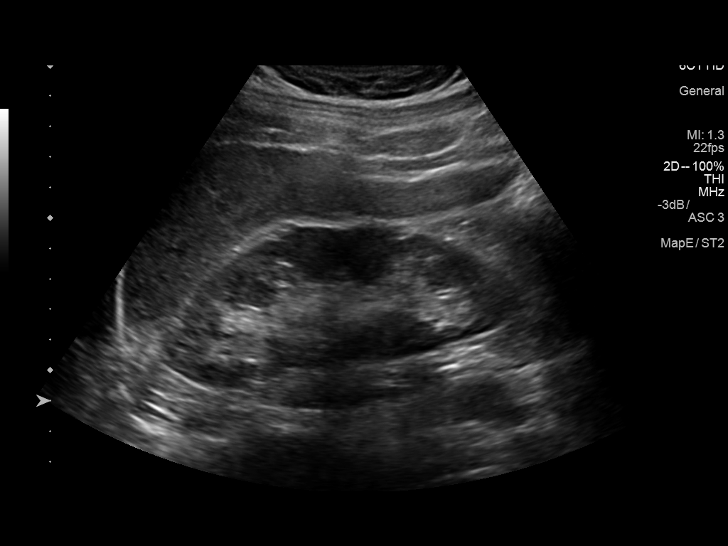
[im 60/80]
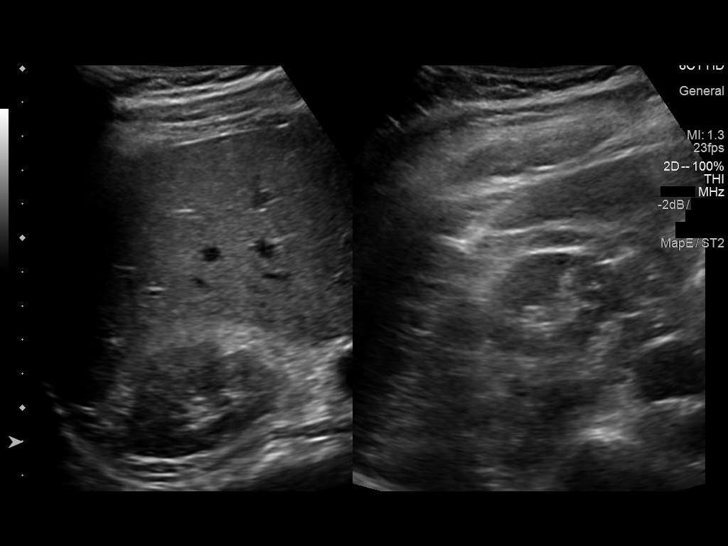
[im 66/80]
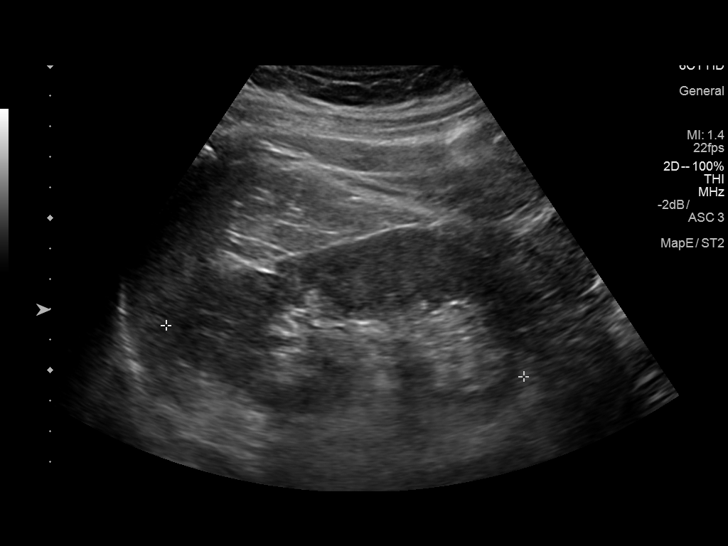
[im 73/80]
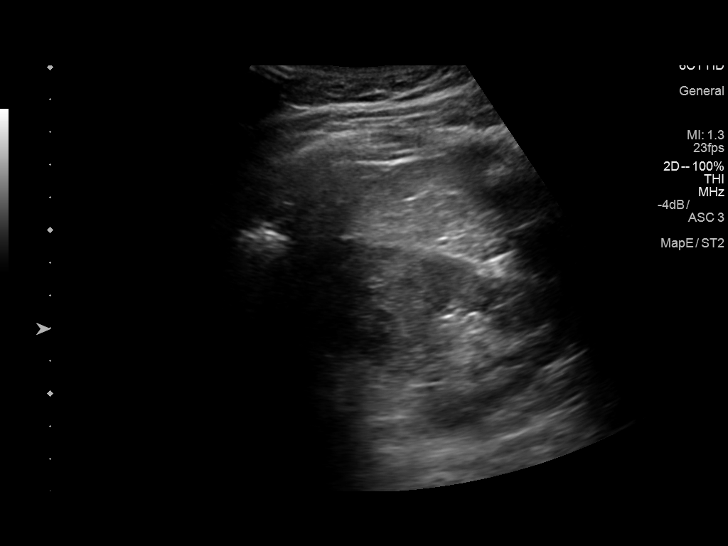
[im 80/80]
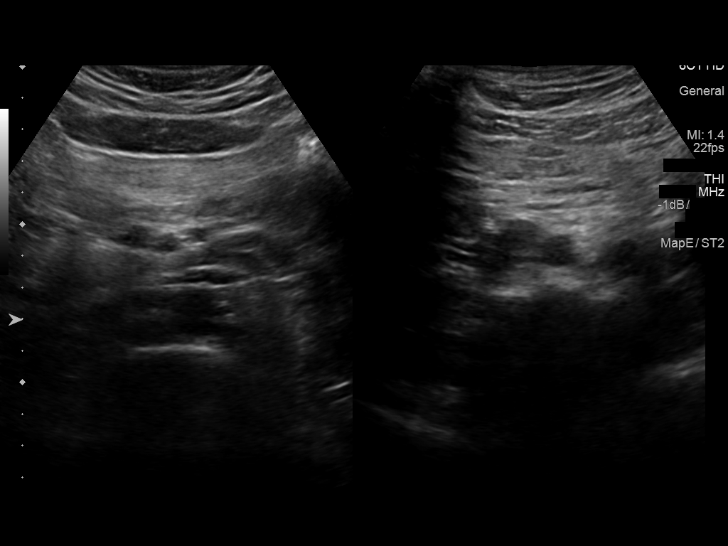

[13 of 25 positions shown; findings below may reference images not displayed]

FINDINGS: Gallbladder: Gallbladder is visualized and no gallstones are noted.
There is no pain over the gallbladder and the gallbladder wall is
normal in thickness.

Common bile duct: Diameter: The common bile duct is normal measuring
2.4 mm in diameter.

Liver: The parenchyma of the liver is normal in echogenicity. No
focal hepatic abnormality is seen. Portal vein is patent on color
Doppler imaging with normal direction of blood flow towards the
liver.

IVC: No abnormality visualized.

Pancreas: The pancreas is moderately well seen with only the tail
obscured by bowel gas.

Spleen: The spleen measures 8.3 cm. A small splenule is present as
well. Probable small calcified splenic granulomas are noted.

Right Kidney: Length: 12.1 cm..  No hydronephrosis is seen.

Left Kidney: Length: 11.9 cm..  No hydronephrosis is noted.

Abdominal aorta: The abdominal aorta is normal in caliber.

Other findings: None.
IMPRESSION: 1. No gallstones.  No ductal dilatation.
2. The parenchyma of the liver is unremarkable.
3. The tail of the pancreas is obscured by bowel gas.
4. Apparent calcified splenic granulomas consistent with prior
granulomatous disease.

## 2019-01-14 ENCOUNTER — Encounter (INDEPENDENT_AMBULATORY_CARE_PROVIDER_SITE_OTHER): Payer: Self-pay | Admitting: Family Medicine

## 2019-01-14 DIAGNOSIS — R109 Unspecified abdominal pain: Secondary | ICD-10-CM

## 2019-01-16 ENCOUNTER — Encounter: Payer: Self-pay | Admitting: *Deleted

## 2019-01-16 ENCOUNTER — Telehealth: Payer: Self-pay | Admitting: *Deleted

## 2019-01-16 NOTE — Telephone Encounter (Signed)
Sent pt message thru mychart advising to contact imaging to reschedule

## 2019-01-16 NOTE — Telephone Encounter (Signed)
-----   Message from Lavada Mesi, MD sent at 01/14/2019 12:51 PM EDT ----- Regarding: CT Pt would like to proceed with previously ordered CT of abdomen.

## 2019-01-27 ENCOUNTER — Ambulatory Visit
Admission: RE | Admit: 2019-01-27 | Discharge: 2019-01-27 | Disposition: A | Discharge status: Home / Self Care | Payer: BC Managed Care – PPO | Source: Ambulatory Visit | Attending: Family Medicine | Admitting: Family Medicine

## 2019-01-27 DIAGNOSIS — R1084 Generalized abdominal pain: Secondary | ICD-10-CM | POA: Diagnosis not present

## 2019-01-27 DIAGNOSIS — R109 Unspecified abdominal pain: Secondary | ICD-10-CM

## 2019-01-28 ENCOUNTER — Telehealth: Payer: Self-pay | Admitting: Family Medicine

## 2019-01-28 NOTE — Telephone Encounter (Signed)
CT scan of abdomen/pelvis is normal.

## 2019-03-15 ENCOUNTER — Other Ambulatory Visit: Payer: Self-pay | Admitting: Family Medicine

## 2019-03-15 DIAGNOSIS — R6889 Other general symptoms and signs: Secondary | ICD-10-CM | POA: Diagnosis not present

## 2019-03-15 DIAGNOSIS — Z20822 Contact with and (suspected) exposure to covid-19: Secondary | ICD-10-CM

## 2019-03-16 LAB — NOVEL CORONAVIRUS, NAA: SARS-CoV-2, NAA: NOT DETECTED

## 2019-03-17 ENCOUNTER — Encounter (INDEPENDENT_AMBULATORY_CARE_PROVIDER_SITE_OTHER): Payer: Self-pay | Admitting: Family Medicine

## 2019-03-17 DIAGNOSIS — Z20828 Contact with and (suspected) exposure to other viral communicable diseases: Secondary | ICD-10-CM

## 2019-03-17 DIAGNOSIS — Z20822 Contact with and (suspected) exposure to covid-19: Secondary | ICD-10-CM

## 2019-03-23 DIAGNOSIS — Z1159 Encounter for screening for other viral diseases: Secondary | ICD-10-CM | POA: Diagnosis not present

## 2019-03-23 DIAGNOSIS — J029 Acute pharyngitis, unspecified: Secondary | ICD-10-CM | POA: Diagnosis not present

## 2019-03-26 DIAGNOSIS — R2232 Localized swelling, mass and lump, left upper limb: Secondary | ICD-10-CM | POA: Diagnosis not present

## 2019-03-26 DIAGNOSIS — M7989 Other specified soft tissue disorders: Secondary | ICD-10-CM | POA: Diagnosis not present

## 2019-03-26 DIAGNOSIS — M79602 Pain in left arm: Secondary | ICD-10-CM | POA: Diagnosis not present

## 2019-03-26 DIAGNOSIS — D485 Neoplasm of uncertain behavior of skin: Secondary | ICD-10-CM | POA: Diagnosis not present

## 2019-05-01 DIAGNOSIS — F419 Anxiety disorder, unspecified: Secondary | ICD-10-CM | POA: Diagnosis not present

## 2019-05-09 DIAGNOSIS — F419 Anxiety disorder, unspecified: Secondary | ICD-10-CM | POA: Diagnosis not present

## 2019-05-16 DIAGNOSIS — F419 Anxiety disorder, unspecified: Secondary | ICD-10-CM | POA: Diagnosis not present

## 2019-05-23 DIAGNOSIS — F419 Anxiety disorder, unspecified: Secondary | ICD-10-CM | POA: Diagnosis not present

## 2019-05-27 DIAGNOSIS — Z20828 Contact with and (suspected) exposure to other viral communicable diseases: Secondary | ICD-10-CM | POA: Diagnosis not present

## 2019-05-27 DIAGNOSIS — J029 Acute pharyngitis, unspecified: Secondary | ICD-10-CM | POA: Diagnosis not present

## 2019-05-30 DIAGNOSIS — F419 Anxiety disorder, unspecified: Secondary | ICD-10-CM | POA: Diagnosis not present

## 2019-06-06 DIAGNOSIS — F419 Anxiety disorder, unspecified: Secondary | ICD-10-CM | POA: Diagnosis not present

## 2019-06-13 DIAGNOSIS — F419 Anxiety disorder, unspecified: Secondary | ICD-10-CM | POA: Diagnosis not present

## 2019-06-20 DIAGNOSIS — F419 Anxiety disorder, unspecified: Secondary | ICD-10-CM | POA: Diagnosis not present

## 2019-06-27 DIAGNOSIS — Z20828 Contact with and (suspected) exposure to other viral communicable diseases: Secondary | ICD-10-CM | POA: Diagnosis not present

## 2019-06-27 DIAGNOSIS — F419 Anxiety disorder, unspecified: Secondary | ICD-10-CM | POA: Diagnosis not present

## 2019-06-27 DIAGNOSIS — R0981 Nasal congestion: Secondary | ICD-10-CM | POA: Diagnosis not present

## 2019-06-30 DIAGNOSIS — R109 Unspecified abdominal pain: Secondary | ICD-10-CM | POA: Diagnosis not present

## 2019-06-30 DIAGNOSIS — Z0000 Encounter for general adult medical examination without abnormal findings: Secondary | ICD-10-CM | POA: Diagnosis not present

## 2019-06-30 DIAGNOSIS — K219 Gastro-esophageal reflux disease without esophagitis: Secondary | ICD-10-CM | POA: Diagnosis not present

## 2019-06-30 DIAGNOSIS — R7309 Other abnormal glucose: Secondary | ICD-10-CM | POA: Diagnosis not present

## 2019-07-04 DIAGNOSIS — F419 Anxiety disorder, unspecified: Secondary | ICD-10-CM | POA: Diagnosis not present

## 2019-07-18 DIAGNOSIS — F419 Anxiety disorder, unspecified: Secondary | ICD-10-CM | POA: Diagnosis not present

## 2019-07-23 ENCOUNTER — Other Ambulatory Visit: Payer: Self-pay

## 2019-07-23 DIAGNOSIS — Z20822 Contact with and (suspected) exposure to covid-19: Secondary | ICD-10-CM

## 2019-07-25 DIAGNOSIS — F419 Anxiety disorder, unspecified: Secondary | ICD-10-CM | POA: Diagnosis not present

## 2019-07-25 LAB — NOVEL CORONAVIRUS, NAA: SARS-CoV-2, NAA: NOT DETECTED

## 2019-08-01 ENCOUNTER — Emergency Department (HOSPITAL_COMMUNITY): Payer: BLUE CROSS/BLUE SHIELD

## 2019-08-01 ENCOUNTER — Emergency Department (HOSPITAL_COMMUNITY)
Admission: EM | Admit: 2019-08-01 | Discharge: 2019-08-02 | Disposition: A | Discharge status: Home / Self Care | Payer: BLUE CROSS/BLUE SHIELD | Attending: Emergency Medicine | Admitting: Emergency Medicine

## 2019-08-01 ENCOUNTER — Encounter (HOSPITAL_COMMUNITY): Payer: Self-pay | Admitting: Emergency Medicine

## 2019-08-01 ENCOUNTER — Other Ambulatory Visit: Payer: Self-pay

## 2019-08-01 DIAGNOSIS — Z79899 Other long term (current) drug therapy: Secondary | ICD-10-CM | POA: Insufficient documentation

## 2019-08-01 DIAGNOSIS — Y939 Activity, unspecified: Secondary | ICD-10-CM | POA: Diagnosis not present

## 2019-08-01 DIAGNOSIS — Y999 Unspecified external cause status: Secondary | ICD-10-CM | POA: Insufficient documentation

## 2019-08-01 DIAGNOSIS — F17210 Nicotine dependence, cigarettes, uncomplicated: Secondary | ICD-10-CM | POA: Diagnosis not present

## 2019-08-01 DIAGNOSIS — Y929 Unspecified place or not applicable: Secondary | ICD-10-CM | POA: Diagnosis not present

## 2019-08-01 DIAGNOSIS — S61215A Laceration without foreign body of left ring finger without damage to nail, initial encounter: Secondary | ICD-10-CM | POA: Diagnosis not present

## 2019-08-01 DIAGNOSIS — S61315A Laceration without foreign body of left ring finger with damage to nail, initial encounter: Secondary | ICD-10-CM | POA: Diagnosis not present

## 2019-08-01 DIAGNOSIS — W312XXA Contact with powered woodworking and forming machines, initial encounter: Secondary | ICD-10-CM | POA: Diagnosis not present

## 2019-08-01 DIAGNOSIS — F419 Anxiety disorder, unspecified: Secondary | ICD-10-CM | POA: Diagnosis not present

## 2019-08-01 MED ORDER — ACETAMINOPHEN 325 MG PO TABS
650.0000 mg | ORAL_TABLET | Freq: Once | ORAL | Status: AC
  Administered 2019-08-01: 650 mg via ORAL
  Filled 2019-08-01: qty 2

## 2019-08-01 MED ORDER — BUPIVACAINE HCL (PF) 0.5 % IJ SOLN
5.0000 mL | Freq: Once | INTRAMUSCULAR | Status: AC
  Administered 2019-08-01: 5 mL
  Filled 2019-08-01: qty 30

## 2019-08-01 MED ORDER — LIDOCAINE HCL (PF) 1 % IJ SOLN
5.0000 mL | Freq: Once | INTRAMUSCULAR | Status: AC
  Administered 2019-08-01: 5 mL
  Filled 2019-08-01: qty 30

## 2019-08-01 MED ORDER — IBUPROFEN 200 MG PO TABS
600.0000 mg | ORAL_TABLET | Freq: Once | ORAL | Status: AC
  Administered 2019-08-01: 600 mg via ORAL
  Filled 2019-08-01: qty 3

## 2019-08-01 NOTE — ED Triage Notes (Signed)
Patient reports cutting the nail of left fourth finger with a table saw. Movement and sensation present in finger. Bleeding controlled.

## 2019-08-02 MED ORDER — CEPHALEXIN 500 MG PO CAPS: 500.0000 mg | ORAL_CAPSULE | Freq: Two times a day (BID) | ORAL | 0 refills | Status: AC

## 2019-08-02 MED ORDER — HYDROCODONE-ACETAMINOPHEN 5-325 MG PO TABS: 1.0000 | ORAL_TABLET | Freq: Four times a day (QID) | ORAL | 0 refills | Status: DC | PRN

## 2019-08-02 NOTE — Discharge Instructions (Addendum)
Please take Ibuprofen (Advil, motrin) and Tylenol (acetaminophen) to relieve your pain.  You may take up to 600 MG (3 pills) of normal strength ibuprofen every 8 hours as needed.  In between doses of ibuprofen you make take tylenol, up to 1,000 mg (two extra strength pills).  Do not take more than 3,000 mg tylenol in a 24 hour period.  Please check all medication labels as many medications such as pain and cold medications may contain tylenol.  Do not drink alcohol while taking these medications.  Do not take other NSAID'S while taking ibuprofen (such as aleve or naproxen).  Please take ibuprofen with food to decrease stomach upset. You may have diarrhea from the antibiotics.  It is very important that you continue to take the antibiotics even if you get diarrhea unless a medical professional tells you that you may stop taking them.  If you stop too early the bacteria you are being treated for will become stronger and you may need different, more powerful antibiotics that have more side effects and worsening diarrhea.  Please stay well hydrated and consider probiotics as they may decrease the severity of your diarrhea.   You are being prescribed a medication which may make you sleepy. For 24 hours after one dose please do not drive, operate heavy machinery, care for a small child with out another adult present, or perform any activities that may cause harm to you or someone else if you were to fall asleep or be impaired.

## 2019-08-02 NOTE — ED Provider Notes (Signed)
[facility]-EMERGENCY DEPT Provider Note   CSN: [id number] Arrival date & time: 08/01/19  2233     History Chief Complaint  Patient presents with  . Laceration    Jonathan Huff. is a 33 y.o. male who presents today for evaluation of a laceration on his left ring finger through the nail.  He reports that shortly prior to arrival he was using a table saw to cut wood when he accidentally cut his finger.  Chart review shows Tdap is up-to-date in the past 5 years.  He denies any weakness numbness or tingling.  No other injuries.  He was able to stop the bleed with pressure PTA.    HPI     Past Medical History:  Diagnosis Date  . GERD (gastroesophageal reflux disease)     Patient Active Problem List   Diagnosis Date Noted  . OSA (obstructive sleep apnea) 05/15/2017  . Chronic pain of both knees 01/24/2017  . GERD (gastroesophageal reflux disease) 01/17/2017  . Class 1 obesity with body mass index (BMI) of 31.0 to 31.9 in adult 01/17/2017    Past Surgical History:  Procedure Laterality Date  . FRACTURE SURGERY         Family History  Problem Relation Age of Onset  . Hypertension Mother   . Diabetes Mother   . Hypertension Father   . Cancer Paternal Grandmother   . Pneumonia Paternal Grandfather     Social History   Tobacco Use  . Smoking status: Current Some Day Smoker    Types: Cigars  . Smokeless tobacco: Never Used  Substance Use Topics  . Alcohol use: Yes    Alcohol/week: 4.0 standard drinks    Types: 4 Cans of beer per week  . Drug use: No    Home Medications Prior to Admission medications   Medication Sig Start Date End Date Taking? Authorizing Provider  cephALEXin (KEFLEX) 500 MG capsule Take 1 capsule (500 mg total) by mouth 2 (two) times daily for 7 days. 08/02/19 08/09/19  Cristina Gong, PA-C  HYDROcodone-acetaminophen (NORCO/VICODIN) 5-325 MG tablet Take 1 tablet by mouth every 6 (six) hours as needed for severe  pain. 08/02/19   Cristina Gong, PA-C  omeprazole (PRILOSEC) 40 MG capsule Take 1 capsule (40 mg total) by mouth daily. 06/14/18   Hilts, Casimiro Needle, MD  oseltamivir (TAMIFLU) 75 MG capsule Take 1 capsule (75 mg total) by mouth daily. 10/11/18   Hilts, Casimiro Needle, MD    Allergies    Patient has no known allergies.  Review of Systems   Review of Systems  Constitutional: Negative for chills and fever.  HENT: Negative for drooling.   Skin:       Laceration present left 4th distal finger.   Neurological: Negative for weakness.  All other systems reviewed and are negative.   Physical Exam Updated Vital Signs BP (!) 136/97 (BP Location: Right Arm)   Pulse 66   Temp 98.3 F (36.8 C) (Oral)   Resp 16   Ht 5\' 8"  (1.727 m)   Wt 90.7 kg   SpO2 100%   BMI 30.41 kg/m   Physical Exam Vitals and nursing note reviewed.  Constitutional:      General: He is not in acute distress.    Appearance: He is well-developed. He is not diaphoretic.  HENT:     Head: Normocephalic and atraumatic.  Eyes:     General: No scleral icterus.       Right eye: No  discharge.        Left eye: No discharge.     Conjunctiva/sclera: Conjunctivae normal.  Cardiovascular:     Rate and Rhythm: Normal rate and regular rhythm.  Pulmonary:     Effort: Pulmonary effort is normal. No respiratory distress.     Breath sounds: No stridor.  Abdominal:     General: There is no distension.  Musculoskeletal:        General: No deformity.     Cervical back: Normal range of motion.     Comments: Full active range of motion of the left fourth finger.  Skin:    General: Skin is warm and dry.     Comments: There is a laceration through the ulnar aspect of the left fourth distal finger.  This extends through the nail with nailbed involvement.  Bleeding is controlled.  Neurological:     General: No focal deficit present.     Mental Status: He is alert.     Motor: No abnormal muscle tone.  Psychiatric:        Behavior:  Behavior normal.     ED Results / Procedures / Treatments   Labs (all labs ordered are listed, but only abnormal results are displayed) Labs Reviewed - No data to display  EKG None  Radiology DG Finger Ring Left  Result Date: 08/01/2019 CLINICAL DATA:  Laceration while cutting nail with a table saw. EXAM: LEFT RING FINGER 2+V COMPARISON:  None. FINDINGS: There is no evidence of fracture or dislocation. There is no evidence of arthropathy or other focal bone abnormality. Soft tissues are unremarkable. No radiopaque foreign body. Site of laceration is not well-defined radiographically. No tracking soft tissue air. IMPRESSION: No fracture. No radiopaque foreign body. Site of laceration not well-defined radiographically. Electronically Signed   By: Narda RutherfordMelanie  Sanford M.D.   On: 08/01/2019 23:54    Procedures .Nail Removal  Date/Time: 08/02/2019 7:10 AM Performed by: Cristina GongHammond, Kruti Horacek W, PA-C Authorized by: Cristina GongHammond, Gavriela Cashin W, PA-C   Consent:    Consent obtained:  Verbal   Consent given by:  Patient   Risks discussed:  Incomplete removal, infection, pain, permanent nail deformity and bleeding   Alternatives discussed:  No treatment, alternative treatment and referral Location:    Hand:  L ring finger Anesthesia (see MAR for exact dosages):    Anesthesia method:  Nerve block   Block location:  Palmar aspect of the left fourth finger.   Block needle gauge:  25 G   Block anesthetic: 50-50 mixture of bupivacaine 0.5% without epi and lidocaine 1% without epi.   Block injection procedure:  Anatomic landmarks identified, anatomic landmarks palpated, introduced needle, negative aspiration for blood and incremental injection   Block outcome:  Anesthesia achieved Nail Removal:    Nail removed:  Partial   Nail removed location: distal. Post-procedure details:    Dressing:  Xeroform gauze and gauze roll   Patient tolerance of procedure:  Tolerated well, no immediate  complications Comments:     Anticipated removal for nailbed repair.  Once nail was removed the nailbed was examined and the laceration is not full-thickness and more of an avulsion without significant displacement that would benefit from suturing.  The proximal part of the nail was left in place as it was not disrupted.    (including critical care time)  Medications Ordered in ED Medications  ibuprofen (ADVIL) tablet 600 mg (600 mg Oral Given 08/01/19 2352)  acetaminophen (TYLENOL) tablet 650 mg (650 mg Oral Given 08/01/19  2352)  lidocaine (PF) (XYLOCAINE) 1 % injection 5 mL (5 mLs Infiltration Given 08/01/19 2358)  bupivacaine (MARCAINE) 0.5 % injection 5 mL (5 mLs Infiltration Given 08/01/19 2352)    ED Course  I have reviewed the triage vital signs and the nursing notes.  Pertinent labs & imaging results that were available during my care of the patient were reviewed by me and considered in my medical decision making (see chart for details).    MDM Rules/Calculators/A&P                     Patient presents today for evaluation of a laceration to his left ring finger that occurred with a saw prior to arrival.  Patient was driving home therefore unable to give sedating pain medicine, he is given ibuprofen and Tylenol.  Digital block was performed with anticipation of nailbed laceration requiring sutures.  Nail was removed around the laceration with the most proximal part left in place. Once full extent of laceration was viewed it does not appear to be fully through the nailbed and more of an avulsion type injury therefore sutures would not provide additional benefit.  His tetanus is up-to-date.  He is given Keflex for prophylaxis of infection.  He is given a short course of opioid pain medicine at home after PMP was reviewed and discussed risks.  Return precautions were discussed with patient who states their understanding.  At the time of discharge patient denied any unaddressed  complaints or concerns.  Patient is agreeable for discharge home.  Final Clinical Impression(s) / ED Diagnoses Final diagnoses:  Laceration of left ring finger without foreign body with damage to nail, initial encounter    Rx / DC Orders ED Discharge Orders         Ordered    HYDROcodone-acetaminophen (NORCO/VICODIN) 5-325 MG tablet  Every 6 hours PRN     08/02/19 0209    cephALEXin (KEFLEX) 500 MG capsule  2 times daily     08/02/19 0209           Lorin Glass, PA-C 08/02/19 [id number]    Palumbo, April, MD 08/02/19 [phone]

## 2019-08-05 DIAGNOSIS — R1011 Right upper quadrant pain: Secondary | ICD-10-CM | POA: Diagnosis not present

## 2019-08-21 DIAGNOSIS — R7989 Other specified abnormal findings of blood chemistry: Secondary | ICD-10-CM | POA: Diagnosis not present

## 2019-08-21 DIAGNOSIS — Z0000 Encounter for general adult medical examination without abnormal findings: Secondary | ICD-10-CM | POA: Diagnosis not present

## 2019-08-21 DIAGNOSIS — R7309 Other abnormal glucose: Secondary | ICD-10-CM | POA: Diagnosis not present

## 2019-08-21 DIAGNOSIS — E7800 Pure hypercholesterolemia, unspecified: Secondary | ICD-10-CM | POA: Diagnosis not present

## 2019-08-26 DIAGNOSIS — Z03818 Encounter for observation for suspected exposure to other biological agents ruled out: Secondary | ICD-10-CM | POA: Diagnosis not present

## 2019-08-28 DIAGNOSIS — Z0000 Encounter for general adult medical examination without abnormal findings: Secondary | ICD-10-CM | POA: Diagnosis not present

## 2019-08-28 DIAGNOSIS — E7800 Pure hypercholesterolemia, unspecified: Secondary | ICD-10-CM | POA: Diagnosis not present

## 2019-08-29 DIAGNOSIS — F419 Anxiety disorder, unspecified: Secondary | ICD-10-CM | POA: Diagnosis not present

## 2019-09-02 ENCOUNTER — Other Ambulatory Visit: Payer: Self-pay | Admitting: Family Medicine

## 2019-09-02 ENCOUNTER — Other Ambulatory Visit (HOSPITAL_COMMUNITY): Payer: Self-pay | Admitting: Family Medicine

## 2019-09-02 DIAGNOSIS — R1011 Right upper quadrant pain: Secondary | ICD-10-CM

## 2019-09-05 DIAGNOSIS — F419 Anxiety disorder, unspecified: Secondary | ICD-10-CM | POA: Diagnosis not present

## 2019-09-12 DIAGNOSIS — F419 Anxiety disorder, unspecified: Secondary | ICD-10-CM | POA: Diagnosis not present

## 2019-09-15 ENCOUNTER — Ambulatory Visit (HOSPITAL_COMMUNITY): Payer: BLUE CROSS/BLUE SHIELD

## 2019-09-15 ENCOUNTER — Encounter (HOSPITAL_COMMUNITY): Payer: Self-pay

## 2019-09-19 DIAGNOSIS — F419 Anxiety disorder, unspecified: Secondary | ICD-10-CM | POA: Diagnosis not present

## 2019-09-26 DIAGNOSIS — F419 Anxiety disorder, unspecified: Secondary | ICD-10-CM | POA: Diagnosis not present

## 2019-10-03 DIAGNOSIS — F419 Anxiety disorder, unspecified: Secondary | ICD-10-CM | POA: Diagnosis not present

## 2019-10-17 DIAGNOSIS — F419 Anxiety disorder, unspecified: Secondary | ICD-10-CM | POA: Diagnosis not present

## 2019-10-24 DIAGNOSIS — F419 Anxiety disorder, unspecified: Secondary | ICD-10-CM | POA: Diagnosis not present

## 2019-11-20 DIAGNOSIS — E7800 Pure hypercholesterolemia, unspecified: Secondary | ICD-10-CM | POA: Diagnosis not present

## 2019-11-21 DIAGNOSIS — F419 Anxiety disorder, unspecified: Secondary | ICD-10-CM | POA: Diagnosis not present

## 2019-11-27 DIAGNOSIS — E7800 Pure hypercholesterolemia, unspecified: Secondary | ICD-10-CM | POA: Diagnosis not present

## 2019-12-05 DIAGNOSIS — F419 Anxiety disorder, unspecified: Secondary | ICD-10-CM | POA: Diagnosis not present

## 2019-12-26 DIAGNOSIS — F419 Anxiety disorder, unspecified: Secondary | ICD-10-CM | POA: Diagnosis not present

## 2020-01-02 DIAGNOSIS — F419 Anxiety disorder, unspecified: Secondary | ICD-10-CM | POA: Diagnosis not present

## 2020-01-07 ENCOUNTER — Other Ambulatory Visit: Payer: BLUE CROSS/BLUE SHIELD

## 2020-01-07 ENCOUNTER — Ambulatory Visit: Payer: BLUE CROSS/BLUE SHIELD | Attending: Internal Medicine

## 2020-01-07 DIAGNOSIS — Z20822 Contact with and (suspected) exposure to covid-19: Secondary | ICD-10-CM | POA: Diagnosis not present

## 2020-01-08 LAB — NOVEL CORONAVIRUS, NAA: SARS-CoV-2, NAA: NOT DETECTED

## 2020-01-08 LAB — SARS-COV-2, NAA 2 DAY TAT

## 2020-01-09 DIAGNOSIS — F419 Anxiety disorder, unspecified: Secondary | ICD-10-CM | POA: Diagnosis not present

## 2020-01-15 DIAGNOSIS — F419 Anxiety disorder, unspecified: Secondary | ICD-10-CM | POA: Diagnosis not present

## 2020-01-22 DIAGNOSIS — J029 Acute pharyngitis, unspecified: Secondary | ICD-10-CM | POA: Diagnosis not present

## 2020-01-23 DIAGNOSIS — F419 Anxiety disorder, unspecified: Secondary | ICD-10-CM | POA: Diagnosis not present

## 2020-02-06 DIAGNOSIS — F419 Anxiety disorder, unspecified: Secondary | ICD-10-CM | POA: Diagnosis not present

## 2020-02-13 DIAGNOSIS — F419 Anxiety disorder, unspecified: Secondary | ICD-10-CM | POA: Diagnosis not present

## 2020-02-19 DIAGNOSIS — E7800 Pure hypercholesterolemia, unspecified: Secondary | ICD-10-CM | POA: Diagnosis not present

## 2020-02-26 DIAGNOSIS — K219 Gastro-esophageal reflux disease without esophagitis: Secondary | ICD-10-CM | POA: Diagnosis not present

## 2020-02-26 DIAGNOSIS — E7800 Pure hypercholesterolemia, unspecified: Secondary | ICD-10-CM | POA: Diagnosis not present

## 2020-02-26 DIAGNOSIS — R7309 Other abnormal glucose: Secondary | ICD-10-CM | POA: Diagnosis not present

## 2020-02-26 DIAGNOSIS — R634 Abnormal weight loss: Secondary | ICD-10-CM | POA: Diagnosis not present

## 2020-03-05 DIAGNOSIS — F419 Anxiety disorder, unspecified: Secondary | ICD-10-CM | POA: Diagnosis not present

## 2020-03-17 DIAGNOSIS — J029 Acute pharyngitis, unspecified: Secondary | ICD-10-CM | POA: Diagnosis not present

## 2020-03-17 DIAGNOSIS — Z20828 Contact with and (suspected) exposure to other viral communicable diseases: Secondary | ICD-10-CM | POA: Diagnosis not present

## 2020-03-22 DIAGNOSIS — Z03818 Encounter for observation for suspected exposure to other biological agents ruled out: Secondary | ICD-10-CM | POA: Diagnosis not present

## 2020-03-23 DIAGNOSIS — Z03818 Encounter for observation for suspected exposure to other biological agents ruled out: Secondary | ICD-10-CM | POA: Diagnosis not present

## 2020-04-02 DIAGNOSIS — F419 Anxiety disorder, unspecified: Secondary | ICD-10-CM | POA: Diagnosis not present

## 2020-04-07 DIAGNOSIS — Z20828 Contact with and (suspected) exposure to other viral communicable diseases: Secondary | ICD-10-CM | POA: Diagnosis not present

## 2020-04-09 DIAGNOSIS — F419 Anxiety disorder, unspecified: Secondary | ICD-10-CM | POA: Diagnosis not present

## 2020-04-23 DIAGNOSIS — F419 Anxiety disorder, unspecified: Secondary | ICD-10-CM | POA: Diagnosis not present

## 2020-05-07 DIAGNOSIS — F419 Anxiety disorder, unspecified: Secondary | ICD-10-CM | POA: Diagnosis not present

## 2020-05-28 DIAGNOSIS — F419 Anxiety disorder, unspecified: Secondary | ICD-10-CM | POA: Diagnosis not present

## 2020-06-07 ENCOUNTER — Other Ambulatory Visit: Payer: BLUE CROSS/BLUE SHIELD

## 2020-06-07 DIAGNOSIS — Z20822 Contact with and (suspected) exposure to covid-19: Secondary | ICD-10-CM

## 2020-06-08 LAB — NOVEL CORONAVIRUS, NAA: SARS-CoV-2, NAA: NOT DETECTED

## 2020-06-08 LAB — SARS-COV-2, NAA 2 DAY TAT

## 2020-06-11 DIAGNOSIS — F419 Anxiety disorder, unspecified: Secondary | ICD-10-CM | POA: Diagnosis not present

## 2020-06-25 DIAGNOSIS — F419 Anxiety disorder, unspecified: Secondary | ICD-10-CM | POA: Diagnosis not present

## 2020-07-30 DIAGNOSIS — F419 Anxiety disorder, unspecified: Secondary | ICD-10-CM | POA: Diagnosis not present

## 2020-08-03 ENCOUNTER — Other Ambulatory Visit: Payer: BLUE CROSS/BLUE SHIELD

## 2020-08-03 DIAGNOSIS — Z20822 Contact with and (suspected) exposure to covid-19: Secondary | ICD-10-CM

## 2020-08-05 LAB — NOVEL CORONAVIRUS, NAA: SARS-CoV-2, NAA: NOT DETECTED

## 2020-08-05 LAB — SARS-COV-2, NAA 2 DAY TAT

## 2020-08-13 DIAGNOSIS — Z20822 Contact with and (suspected) exposure to covid-19: Secondary | ICD-10-CM | POA: Diagnosis not present

## 2020-08-13 DIAGNOSIS — F419 Anxiety disorder, unspecified: Secondary | ICD-10-CM | POA: Diagnosis not present

## 2020-08-24 DIAGNOSIS — R634 Abnormal weight loss: Secondary | ICD-10-CM | POA: Diagnosis not present

## 2020-08-24 DIAGNOSIS — R7309 Other abnormal glucose: Secondary | ICD-10-CM | POA: Diagnosis not present

## 2020-08-24 DIAGNOSIS — K219 Gastro-esophageal reflux disease without esophagitis: Secondary | ICD-10-CM | POA: Diagnosis not present

## 2020-08-24 DIAGNOSIS — E7800 Pure hypercholesterolemia, unspecified: Secondary | ICD-10-CM | POA: Diagnosis not present

## 2020-09-03 DIAGNOSIS — F419 Anxiety disorder, unspecified: Secondary | ICD-10-CM | POA: Diagnosis not present

## 2020-09-14 DIAGNOSIS — Z0000 Encounter for general adult medical examination without abnormal findings: Secondary | ICD-10-CM | POA: Diagnosis not present

## 2020-09-14 DIAGNOSIS — E7800 Pure hypercholesterolemia, unspecified: Secondary | ICD-10-CM | POA: Diagnosis not present

## 2020-10-08 DIAGNOSIS — F419 Anxiety disorder, unspecified: Secondary | ICD-10-CM | POA: Diagnosis not present

## 2020-12-28 DIAGNOSIS — E7800 Pure hypercholesterolemia, unspecified: Secondary | ICD-10-CM | POA: Diagnosis not present

## 2021-05-21 IMAGING — CR DG FINGER RING 2+V*L*
3 series · 3 of 3 positions shown · non-contrast
Comparison: None.

CLINICAL DATA: Laceration while cutting nail with a table saw.

EXAM:
LEFT RING FINGER 2+V

[x finger pa left]
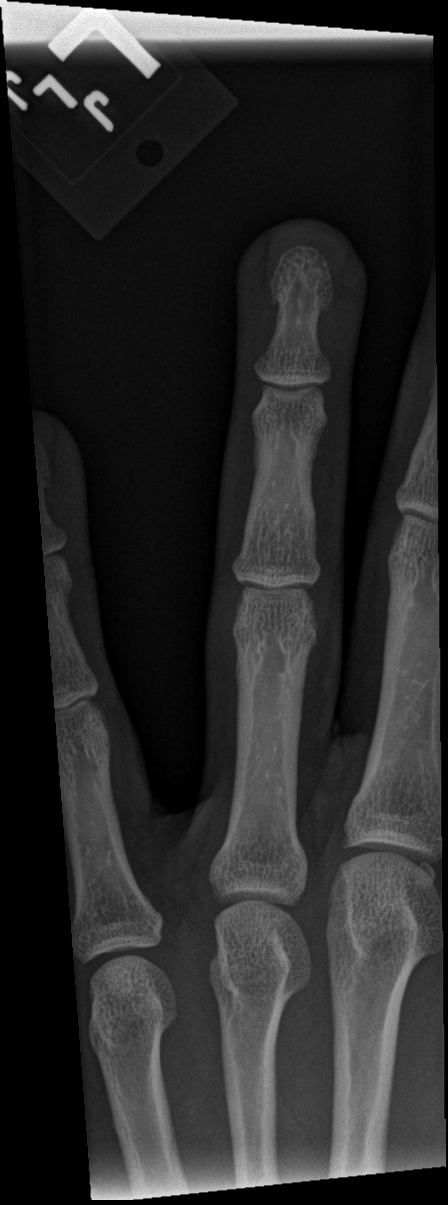

[x finger obl left]
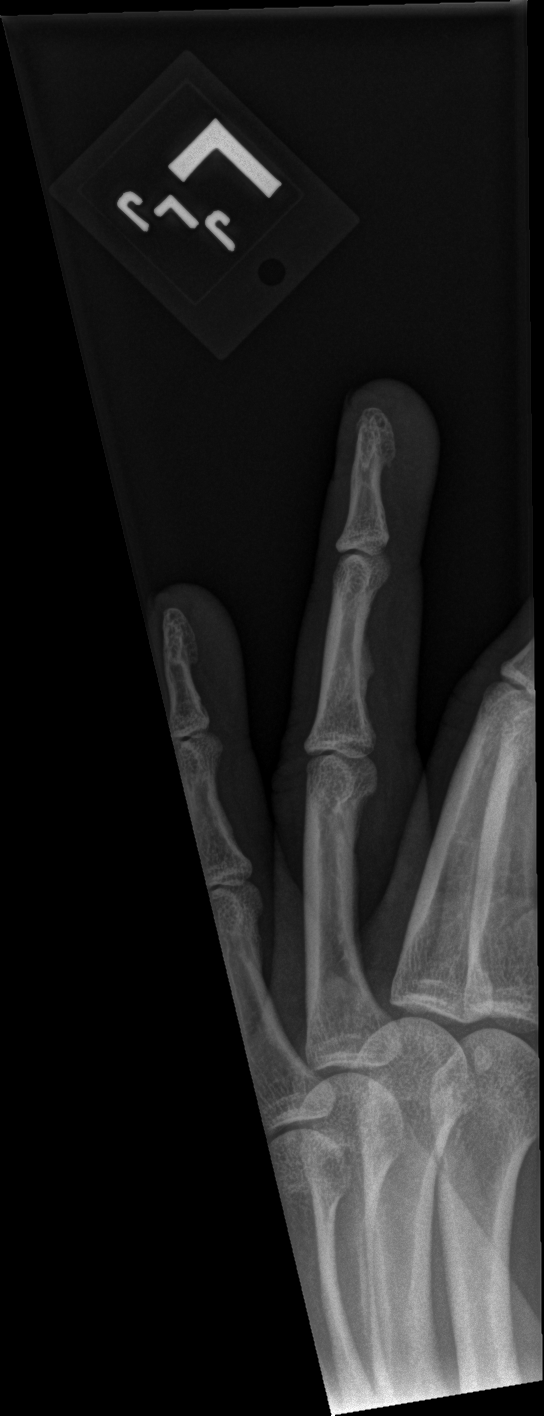

[x finger lat left]
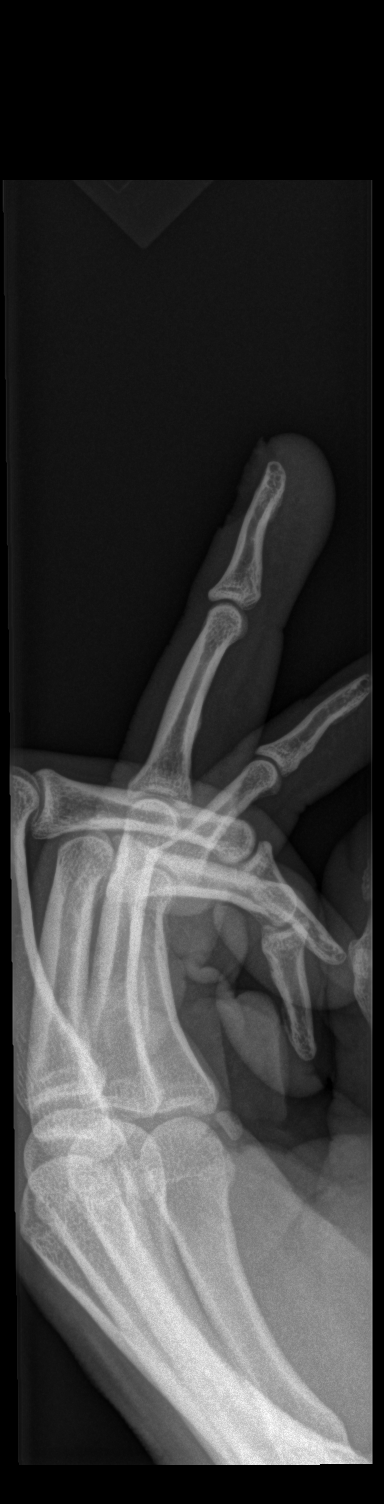

[3 of 3 positions shown; findings below may reference images not displayed]

FINDINGS: There is no evidence of fracture or dislocation. There is no
evidence of arthropathy or other focal bone abnormality. Soft
tissues are unremarkable. No radiopaque foreign body. Site of
laceration is not well-defined radiographically. No tracking soft
tissue air.
IMPRESSION: No fracture. No radiopaque foreign body. Site of laceration not
well-defined radiographically.

## 2021-07-16 ENCOUNTER — Encounter (HOSPITAL_COMMUNITY): Payer: Self-pay | Admitting: *Deleted

## 2021-07-16 ENCOUNTER — Other Ambulatory Visit: Payer: Self-pay

## 2021-07-16 ENCOUNTER — Ambulatory Visit (HOSPITAL_COMMUNITY)
Admission: EM | Admit: 2021-07-16 | Discharge: 2021-07-16 | Disposition: A | Discharge status: Home / Self Care | Payer: Managed Care, Other (non HMO) | Attending: Physician Assistant | Admitting: Physician Assistant

## 2021-07-16 DIAGNOSIS — J069 Acute upper respiratory infection, unspecified: Secondary | ICD-10-CM

## 2021-07-16 DIAGNOSIS — J029 Acute pharyngitis, unspecified: Secondary | ICD-10-CM | POA: Diagnosis not present

## 2021-07-16 DIAGNOSIS — F17290 Nicotine dependence, other tobacco product, uncomplicated: Secondary | ICD-10-CM | POA: Diagnosis not present

## 2021-07-16 DIAGNOSIS — Z8616 Personal history of COVID-19: Secondary | ICD-10-CM | POA: Diagnosis not present

## 2021-07-16 DIAGNOSIS — Z20822 Contact with and (suspected) exposure to covid-19: Secondary | ICD-10-CM | POA: Insufficient documentation

## 2021-07-16 DIAGNOSIS — R051 Acute cough: Secondary | ICD-10-CM | POA: Diagnosis present

## 2021-07-16 LAB — POC INFLUENZA A AND B ANTIGEN (URGENT CARE ONLY)
INFLUENZA A ANTIGEN, POC: NEGATIVE
INFLUENZA B ANTIGEN, POC: NEGATIVE

## 2021-07-16 LAB — SARS CORONAVIRUS 2 (TAT 6-24 HRS): SARS Coronavirus 2: NEGATIVE

## 2021-07-16 MED ORDER — PROMETHAZINE-DM 6.25-15 MG/5ML PO SYRP: 5.0000 mL | ORAL_SOLUTION | Freq: Four times a day (QID) | ORAL | 0 refills | Status: AC | PRN

## 2021-07-16 NOTE — Discharge Instructions (Signed)
Your flu test is negative.  We will contact you if your COVID test is positive.  Please remain in isolation until you receive these results.  Use Promethazine DM for cough.  This make you sleepy so do not drink alcohol or drive while taking this.  Alternate Tylenol ibuprofen for fever and pain.  Use Mucinex and Flonase for additional symptom relief.  If anything worsens please return for reevaluation.  Make sure you rest and drink plenty of fluid.  If you have any severe symptoms including high fever not spreading to medication, nausea/vomiting interfering with oral intake, chest pain, shortness of breath, severe cough you need to go to the emergency room.

## 2021-07-16 NOTE — ED Provider Notes (Signed)
[facility]    CSN: [id number] Arrival date & time: 07/16/21  1301      History   Chief Complaint Chief Complaint  Patient presents with   Sore Throat   Headache   Generalized Body Aches    HPI Jonathan Huff. is a 35 y.o. male.   Patient presents today with 24-hour history of URI symptoms.  He reports headache, body aches, sore throat, nasal congestion, cough.  Denies any chest pain, shortness of breath, nausea, vomiting.  Reports numerous sick contacts at his place of employment.  He has had influenza and COVID-19 vaccinations including booster.  He had COVID approximately 1 year ago.  Taken at home COVID test that was negative.  He has tried NyQuil and over-the-counter medication without improvement of symptoms.  Denies any recent antibiotic use.  Denies history of asthma, allergies, COPD.  He does not smoke.   Past Medical History:  Diagnosis Date   GERD (gastroesophageal reflux disease)     Patient Active Problem List   Diagnosis Date Noted   OSA (obstructive sleep apnea) 05/15/2017   Chronic pain of both knees 01/24/2017   GERD (gastroesophageal reflux disease) 01/17/2017   Class 1 obesity with body mass index (BMI) of 31.0 to 31.9 in adult 01/17/2017    Past Surgical History:  Procedure Laterality Date   FRACTURE SURGERY         Home Medications    Prior to Admission medications   Medication Sig Start Date End Date Taking? Authorizing Provider  promethazine-dextromethorphan (PROMETHAZINE-DM) 6.25-15 MG/5ML syrup Take 5 mLs by mouth 4 (four) times daily as needed for cough. 07/16/21  Yes Raechell Singleton, Noberto Retort, PA-C    Family History Family History  Problem Relation Age of Onset   Hypertension Mother    Diabetes Mother    Hypertension Father    Cancer Paternal Grandmother    Pneumonia Paternal Grandfather     Social History Social History   Tobacco Use   Smoking status: Some Days    Types: Cigars   Smokeless tobacco: Never  Vaping Use    Vaping Use: Never used  Substance Use Topics   Alcohol use: Yes    Alcohol/week: 4.0 standard drinks    Types: 4 Cans of beer per week   Drug use: No     Allergies   Patient has no known allergies.   Review of Systems Review of Systems  Constitutional:  Positive for activity change, fatigue and fever. Negative for appetite change.  HENT:  Positive for congestion and sore throat. Negative for sinus pressure and sneezing.   Respiratory:  Positive for cough. Negative for shortness of breath.   Cardiovascular:  Negative for chest pain.  Gastrointestinal:  Negative for abdominal pain, diarrhea, nausea and vomiting.  Musculoskeletal:  Positive for arthralgias and myalgias.  Neurological:  Negative for dizziness, light-headedness and headaches.    Physical Exam Triage Vital Signs ED Triage Vitals  Enc Vitals Group     BP 07/16/21 1418 121/81     Pulse Rate 07/16/21 1418 80     Resp 07/16/21 1418 18     Temp 07/16/21 1418 100.2 F (37.9 C)     Temp src --      SpO2 07/16/21 1418 94 %     Weight --      Height --      Head Circumference --      Peak Flow --      Pain Score 07/16/21 1415  6     Pain Loc --      Pain Edu? --      Excl. in GC? --    No data found.  Updated Vital Signs BP 121/81   Pulse 80   Temp 100.2 F (37.9 C)   Resp 18   SpO2 94%   Visual Acuity Right Eye Distance:   Left Eye Distance:   Bilateral Distance:    Right Eye Near:   Left Eye Near:    Bilateral Near:     Physical Exam Vitals reviewed.  Constitutional:      General: He is awake.     Appearance: Normal appearance. He is well-developed. He is not ill-appearing.     Comments: Very pleasant male appears stated age no acute distress sitting comfortably in exam room  HENT:     Head: Normocephalic and atraumatic.     Right Ear: Tympanic membrane, ear canal and external ear normal. Tympanic membrane is not erythematous or bulging.     Left Ear: Tympanic membrane, ear canal and  external ear normal. Tympanic membrane is not erythematous or bulging.     Nose: Nose normal.     Mouth/Throat:     Pharynx: Uvula midline. Posterior oropharyngeal erythema present. No oropharyngeal exudate or uvula swelling.  Cardiovascular:     Rate and Rhythm: Normal rate and regular rhythm.     Heart sounds: Normal heart sounds, S1 normal and S2 normal. No murmur heard. Pulmonary:     Effort: Pulmonary effort is normal. No accessory muscle usage or respiratory distress.     Breath sounds: Normal breath sounds. No stridor. No wheezing, rhonchi or rales.     Comments: Clear to auscultation bilaterally Abdominal:     General: Bowel sounds are normal.     Palpations: Abdomen is soft.     Tenderness: There is no abdominal tenderness.  Neurological:     Mental Status: He is alert.  Psychiatric:        Behavior: Behavior is cooperative.     UC Treatments / Results  Labs (all labs ordered are listed, but only abnormal results are displayed) Labs Reviewed  SARS CORONAVIRUS 2 (TAT 6-24 HRS)  POC INFLUENZA A AND B ANTIGEN (URGENT CARE ONLY)    EKG   Radiology No results found.  Procedures Procedures (including critical care time)  Medications Ordered in UC Medications - No data to display  Initial Impression / Assessment and Plan / UC Course  I have reviewed the triage vital signs and the nursing notes.  Pertinent labs & imaging results that were available during my care of the patient were reviewed by me and considered in my medical decision making (see chart for details).     Discussed likely viral etiology given short duration of symptoms.  Influenza testing was negative in clinic today.  COVID test was obtained and is pending.  He was instructed to remain in isolation until results are obtained and was given work excuse note with current CDC return to work guidelines based on COVID test result.  He was given Promethazine DM for cough with instruction not to drive or  drink alcohol with this medication as drowsiness is a common side effect.  Recommended he use Tylenol and ibuprofen for fever and pain and Mucinex and Flonase for cough and congestion.  He is to rest and drink plenty of fluid.  Discussed alarm symptoms that warrant emergent evaluation.  Strict return precautions given to which he expressed understanding.  Final Clinical Impressions(s) / UC Diagnoses   Final diagnoses:  Upper respiratory tract infection, unspecified type  Acute cough     Discharge Instructions      Your flu test is negative.  We will contact you if your COVID test is positive.  Please remain in isolation until you receive these results.  Use Promethazine DM for cough.  This make you sleepy so do not drink alcohol or drive while taking this.  Alternate Tylenol ibuprofen for fever and pain.  Use Mucinex and Flonase for additional symptom relief.  If anything worsens please return for reevaluation.  Make sure you rest and drink plenty of fluid.  If you have any severe symptoms including high fever not spreading to medication, nausea/vomiting interfering with oral intake, chest pain, shortness of breath, severe cough you need to go to the emergency room.     ED Prescriptions     Medication Sig Dispense Auth. Provider   promethazine-dextromethorphan (PROMETHAZINE-DM) 6.25-15 MG/5ML syrup Take 5 mLs by mouth 4 (four) times daily as needed for cough. 118 mL Shafter Jupin K, PA-C      PDMP not reviewed this encounter.   Jeani Hawking, PA-C 07/16/21 1621

## 2021-07-16 NOTE — ED Triage Notes (Signed)
Pt reports Sx's started yesterday. Pt has HA,Body aches and sore throat.

## 2023-03-09 DIAGNOSIS — F419 Anxiety disorder, unspecified: Secondary | ICD-10-CM | POA: Diagnosis not present

## 2023-03-14 DIAGNOSIS — E6609 Other obesity due to excess calories: Secondary | ICD-10-CM | POA: Diagnosis not present

## 2023-03-14 DIAGNOSIS — Z713 Dietary counseling and surveillance: Secondary | ICD-10-CM | POA: Diagnosis not present

## 2023-03-28 DIAGNOSIS — Z713 Dietary counseling and surveillance: Secondary | ICD-10-CM | POA: Diagnosis not present

## 2023-03-28 DIAGNOSIS — E6609 Other obesity due to excess calories: Secondary | ICD-10-CM | POA: Diagnosis not present

## 2023-04-06 DIAGNOSIS — F419 Anxiety disorder, unspecified: Secondary | ICD-10-CM | POA: Diagnosis not present

## 2023-04-20 DIAGNOSIS — F419 Anxiety disorder, unspecified: Secondary | ICD-10-CM | POA: Diagnosis not present

## 2023-04-27 DIAGNOSIS — F419 Anxiety disorder, unspecified: Secondary | ICD-10-CM | POA: Diagnosis not present

## 2023-05-04 DIAGNOSIS — F419 Anxiety disorder, unspecified: Secondary | ICD-10-CM | POA: Diagnosis not present

## 2023-05-11 DIAGNOSIS — F419 Anxiety disorder, unspecified: Secondary | ICD-10-CM | POA: Diagnosis not present

## 2023-06-01 DIAGNOSIS — F419 Anxiety disorder, unspecified: Secondary | ICD-10-CM | POA: Diagnosis not present

## 2023-06-19 DIAGNOSIS — Z713 Dietary counseling and surveillance: Secondary | ICD-10-CM | POA: Diagnosis not present

## 2023-06-19 DIAGNOSIS — Z8342 Family history of familial hypercholesterolemia: Secondary | ICD-10-CM | POA: Diagnosis not present

## 2023-06-29 DIAGNOSIS — F419 Anxiety disorder, unspecified: Secondary | ICD-10-CM | POA: Diagnosis not present

## 2023-07-06 DIAGNOSIS — Z713 Dietary counseling and surveillance: Secondary | ICD-10-CM | POA: Diagnosis not present

## 2023-07-06 DIAGNOSIS — Z8342 Family history of familial hypercholesterolemia: Secondary | ICD-10-CM | POA: Diagnosis not present

## 2023-07-19 DIAGNOSIS — R509 Fever, unspecified: Secondary | ICD-10-CM | POA: Diagnosis not present

## 2023-07-19 DIAGNOSIS — J988 Other specified respiratory disorders: Secondary | ICD-10-CM | POA: Diagnosis not present

## 2023-07-20 DIAGNOSIS — F419 Anxiety disorder, unspecified: Secondary | ICD-10-CM | POA: Diagnosis not present

## 2023-07-20 DIAGNOSIS — Z8342 Family history of familial hypercholesterolemia: Secondary | ICD-10-CM | POA: Diagnosis not present

## 2023-07-20 DIAGNOSIS — Z713 Dietary counseling and surveillance: Secondary | ICD-10-CM | POA: Diagnosis not present

## 2023-08-03 DIAGNOSIS — Z8342 Family history of familial hypercholesterolemia: Secondary | ICD-10-CM | POA: Diagnosis not present

## 2023-08-03 DIAGNOSIS — Z713 Dietary counseling and surveillance: Secondary | ICD-10-CM | POA: Diagnosis not present

## 2023-08-24 DIAGNOSIS — Z713 Dietary counseling and surveillance: Secondary | ICD-10-CM | POA: Diagnosis not present

## 2023-08-24 DIAGNOSIS — Z8342 Family history of familial hypercholesterolemia: Secondary | ICD-10-CM | POA: Diagnosis not present

## 2023-08-31 DIAGNOSIS — F419 Anxiety disorder, unspecified: Secondary | ICD-10-CM | POA: Diagnosis not present

## 2023-09-07 DIAGNOSIS — Z8342 Family history of familial hypercholesterolemia: Secondary | ICD-10-CM | POA: Diagnosis not present

## 2023-09-07 DIAGNOSIS — Z713 Dietary counseling and surveillance: Secondary | ICD-10-CM | POA: Diagnosis not present

## 2023-09-07 DIAGNOSIS — F419 Anxiety disorder, unspecified: Secondary | ICD-10-CM | POA: Diagnosis not present

## 2023-09-21 DIAGNOSIS — F419 Anxiety disorder, unspecified: Secondary | ICD-10-CM | POA: Diagnosis not present

## 2023-09-26 DIAGNOSIS — Z713 Dietary counseling and surveillance: Secondary | ICD-10-CM | POA: Diagnosis not present

## 2023-09-26 DIAGNOSIS — Z8342 Family history of familial hypercholesterolemia: Secondary | ICD-10-CM | POA: Diagnosis not present

## 2023-10-12 DIAGNOSIS — Z8342 Family history of familial hypercholesterolemia: Secondary | ICD-10-CM | POA: Diagnosis not present

## 2023-10-12 DIAGNOSIS — Z713 Dietary counseling and surveillance: Secondary | ICD-10-CM | POA: Diagnosis not present

## 2023-10-12 DIAGNOSIS — F419 Anxiety disorder, unspecified: Secondary | ICD-10-CM | POA: Diagnosis not present

## 2023-10-19 DIAGNOSIS — F419 Anxiety disorder, unspecified: Secondary | ICD-10-CM | POA: Diagnosis not present

## 2023-10-26 DIAGNOSIS — F419 Anxiety disorder, unspecified: Secondary | ICD-10-CM | POA: Diagnosis not present

## 2023-11-07 DIAGNOSIS — Z713 Dietary counseling and surveillance: Secondary | ICD-10-CM | POA: Diagnosis not present

## 2023-11-07 DIAGNOSIS — Z8342 Family history of familial hypercholesterolemia: Secondary | ICD-10-CM | POA: Diagnosis not present

## 2023-11-16 DIAGNOSIS — F419 Anxiety disorder, unspecified: Secondary | ICD-10-CM | POA: Diagnosis not present

## 2023-11-20 DIAGNOSIS — K219 Gastro-esophageal reflux disease without esophagitis: Secondary | ICD-10-CM | POA: Diagnosis not present

## 2023-11-23 DIAGNOSIS — F419 Anxiety disorder, unspecified: Secondary | ICD-10-CM | POA: Diagnosis not present

## 2023-11-26 DIAGNOSIS — E7800 Pure hypercholesterolemia, unspecified: Secondary | ICD-10-CM | POA: Diagnosis not present

## 2023-11-26 DIAGNOSIS — R7989 Other specified abnormal findings of blood chemistry: Secondary | ICD-10-CM | POA: Diagnosis not present

## 2023-11-26 DIAGNOSIS — R7309 Other abnormal glucose: Secondary | ICD-10-CM | POA: Diagnosis not present

## 2023-11-26 DIAGNOSIS — Z79899 Other long term (current) drug therapy: Secondary | ICD-10-CM | POA: Diagnosis not present

## 2023-12-03 ENCOUNTER — Other Ambulatory Visit (HOSPITAL_COMMUNITY): Payer: Self-pay | Admitting: Family Medicine

## 2023-12-03 DIAGNOSIS — K219 Gastro-esophageal reflux disease without esophagitis: Secondary | ICD-10-CM | POA: Diagnosis not present

## 2023-12-03 DIAGNOSIS — E7800 Pure hypercholesterolemia, unspecified: Secondary | ICD-10-CM | POA: Diagnosis not present

## 2023-12-03 DIAGNOSIS — Z719 Counseling, unspecified: Secondary | ICD-10-CM | POA: Diagnosis not present

## 2023-12-03 DIAGNOSIS — Z0000 Encounter for general adult medical examination without abnormal findings: Secondary | ICD-10-CM | POA: Diagnosis not present

## 2023-12-05 DIAGNOSIS — Z713 Dietary counseling and surveillance: Secondary | ICD-10-CM | POA: Diagnosis not present

## 2023-12-05 DIAGNOSIS — Z8342 Family history of familial hypercholesterolemia: Secondary | ICD-10-CM | POA: Diagnosis not present

## 2023-12-14 DIAGNOSIS — F419 Anxiety disorder, unspecified: Secondary | ICD-10-CM | POA: Diagnosis not present

## 2023-12-21 DIAGNOSIS — F419 Anxiety disorder, unspecified: Secondary | ICD-10-CM | POA: Diagnosis not present

## 2023-12-28 ENCOUNTER — Encounter (HOSPITAL_COMMUNITY): Payer: Self-pay

## 2023-12-28 ENCOUNTER — Ambulatory Visit (HOSPITAL_COMMUNITY)
Admission: RE | Admit: 2023-12-28 | Discharge: 2023-12-28 | Disposition: A | Discharge status: Home / Self Care | Payer: Self-pay | Source: Ambulatory Visit | Attending: Family Medicine | Admitting: Family Medicine

## 2023-12-28 DIAGNOSIS — E7800 Pure hypercholesterolemia, unspecified: Secondary | ICD-10-CM | POA: Insufficient documentation

## 2024-01-04 DIAGNOSIS — F419 Anxiety disorder, unspecified: Secondary | ICD-10-CM | POA: Diagnosis not present

## 2024-01-10 DIAGNOSIS — F419 Anxiety disorder, unspecified: Secondary | ICD-10-CM | POA: Diagnosis not present

## 2024-01-18 DIAGNOSIS — F419 Anxiety disorder, unspecified: Secondary | ICD-10-CM | POA: Diagnosis not present

## 2024-01-25 DIAGNOSIS — F419 Anxiety disorder, unspecified: Secondary | ICD-10-CM | POA: Diagnosis not present

## 2024-02-13 DIAGNOSIS — R0781 Pleurodynia: Secondary | ICD-10-CM | POA: Diagnosis not present

## 2024-02-22 DIAGNOSIS — F419 Anxiety disorder, unspecified: Secondary | ICD-10-CM | POA: Diagnosis not present

## 2024-02-26 ENCOUNTER — Other Ambulatory Visit: Payer: Self-pay | Admitting: Family Medicine

## 2024-02-26 DIAGNOSIS — M549 Dorsalgia, unspecified: Secondary | ICD-10-CM

## 2024-02-26 DIAGNOSIS — R0781 Pleurodynia: Secondary | ICD-10-CM

## 2024-02-27 ENCOUNTER — Encounter: Payer: Self-pay | Admitting: Family Medicine

## 2024-02-29 DIAGNOSIS — F419 Anxiety disorder, unspecified: Secondary | ICD-10-CM | POA: Diagnosis not present

## 2024-03-15 ENCOUNTER — Ambulatory Visit

## 2024-03-21 DIAGNOSIS — F39 Unspecified mood [affective] disorder: Secondary | ICD-10-CM | POA: Diagnosis not present

## 2024-03-21 DIAGNOSIS — F411 Generalized anxiety disorder: Secondary | ICD-10-CM | POA: Diagnosis not present

## 2024-04-04 DIAGNOSIS — F39 Unspecified mood [affective] disorder: Secondary | ICD-10-CM | POA: Diagnosis not present

## 2024-04-11 DIAGNOSIS — F39 Unspecified mood [affective] disorder: Secondary | ICD-10-CM | POA: Diagnosis not present

## 2024-04-18 DIAGNOSIS — F411 Generalized anxiety disorder: Secondary | ICD-10-CM | POA: Diagnosis not present

## 2024-04-18 DIAGNOSIS — F39 Unspecified mood [affective] disorder: Secondary | ICD-10-CM | POA: Diagnosis not present

## 2024-04-21 DIAGNOSIS — M79671 Pain in right foot: Secondary | ICD-10-CM | POA: Diagnosis not present

## 2024-04-21 DIAGNOSIS — Z23 Encounter for immunization: Secondary | ICD-10-CM | POA: Diagnosis not present

## 2024-04-25 ENCOUNTER — Ambulatory Visit (INDEPENDENT_AMBULATORY_CARE_PROVIDER_SITE_OTHER)

## 2024-04-25 ENCOUNTER — Ambulatory Visit: Admitting: Podiatry

## 2024-04-25 ENCOUNTER — Encounter: Payer: Self-pay | Admitting: Podiatry

## 2024-04-25 DIAGNOSIS — M7751 Other enthesopathy of right foot: Secondary | ICD-10-CM | POA: Diagnosis not present

## 2024-04-25 DIAGNOSIS — F39 Unspecified mood [affective] disorder: Secondary | ICD-10-CM | POA: Diagnosis not present

## 2024-04-25 DIAGNOSIS — S99921A Unspecified injury of right foot, initial encounter: Secondary | ICD-10-CM

## 2024-04-25 MED ORDER — MELOXICAM 15 MG PO TABS: 15.0000 mg | ORAL_TABLET | Freq: Every day | ORAL | 0 refills | Status: AC

## 2024-04-25 NOTE — Patient Instructions (Addendum)
 Maintain the MPJ extensor/plantar plate taping technique on the right 2nd and 3rd toes as demonstrated in the office today over the next several weeks.  You can likely find a good video on this if you search [place]  [facility] on YouTube. Utilize 1 inch athletic paper or silk tape ripped in half lengthwise to form 0.5 inch strips.  Try and use 4-6 strips for each toe.  Pad the ball of your foot using metatarsal pads.  You can buy these off [employer].  Make sure that you are applying the pad just behind the bony prominences on the ball of your foot.  Take NSAIDs as directed.  Ice over the area as needed and apply compression as needed.

## 2024-04-25 NOTE — Progress Notes (Signed)
       Chief Complaint  Patient presents with   Toe Pain    Pt here for R 2nd 3rd toe and meta pain.  Toe popped [place] the beach 2 [place] ago  Still sore and swollen.    HPI: 38 y.o. male presents today with right forefoot pain. Reports hyperextending the right 2nd and 3rd toes a little over 2 weeks ago. 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  Reports ongoing pain and swelling.  Had recent visit to urgent care where he was told he may have fracture in this area based on radiographs.  Pain 4/10 today.  Past Medical History:  Diagnosis Date   GERD (gastroesophageal reflux disease)     Past Surgical History:  Procedure Laterality Date   FRACTURE SURGERY      No Known Allergies  ROS    Physical Exam: There were no vitals filed for this visit.  General: The patient is alert and oriented x3 in no acute distress.  Dermatology: Skin is warm, dry and supple bilateral lower extremities. Interspaces are clear of maceration and debris.    Vascular: Palpable pedal pulses bilaterally. Capillary refill within normal limits.  No appreciable edema.  No erythema or calor.  Neurological: Light touch sensation grossly intact bilateral feet.   Musculoskeletal Exam: Right foot pain on palpation of 2nd and 3rd MTPJ's.  Some pain with end stage dorsiflexion and plantarflexion.  Subtle increased Lachman sign right second MPJ. Reducible hammertoes.  Radiographic Exam: Right foot 3 views weightbearing 04/25/24 Normal osseous mineralization. Joint spaces preserved.  No fractures or osseous irregularities noted. Hammertoe deformities present.  Assessment/Plan of Care: 1. Capsulitis of metatarsophalangeal (MTP) joint of right foot   2. Injury of plantar plate of right foot, initial encounter      Meds ordered this encounter  Medications   meloxicam  (MOBIC ) 15 MG tablet    Sig: Take 1 tablet (15 mg total) by mouth daily.    Dispense:  30 tablet    Refill:  0   None  Discussed  clinical findings with patient today.  Radiographs reviewed with patient.  Favor plantar plate injury and capsulitis of second third MTPJ right foot from hyperextension mechanism.  Ideally would place patient in cam boot however likely cannot continue with work in this.  We will see how he does in regular shoe gear using tensor capsulitis taping technique to stabilize the MTPJ's and metatarsal pads to further offload the area.  Course of oral meloxicam .  Utilize RICE therapy.  Demonstrated the taping technique provided written instructions with online resource to demonstrate technique.  Reevaluate in 3 to 4 weeks.  Bernette Seeman L. Lamount MAUL, AACFAS [facility]     [address] [place], [place] [address]                Office [phone]  Fax [phone]

## 2024-05-02 DIAGNOSIS — F39 Unspecified mood [affective] disorder: Secondary | ICD-10-CM | POA: Diagnosis not present

## 2024-05-09 DIAGNOSIS — F39 Unspecified mood [affective] disorder: Secondary | ICD-10-CM | POA: Diagnosis not present

## 2024-05-23 DIAGNOSIS — F39 Unspecified mood [affective] disorder: Secondary | ICD-10-CM | POA: Diagnosis not present

## 2024-05-29 ENCOUNTER — Ambulatory Visit: Admitting: Podiatry

## 2024-05-29 ENCOUNTER — Encounter: Payer: Self-pay | Admitting: Podiatry

## 2024-05-29 DIAGNOSIS — M7751 Other enthesopathy of right foot: Secondary | ICD-10-CM

## 2024-05-29 DIAGNOSIS — S99921D Unspecified injury of right foot, subsequent encounter: Secondary | ICD-10-CM

## 2024-05-29 NOTE — Progress Notes (Signed)
       Chief Complaint  Patient presents with   Foot Pain    R Plantar plate injury/capsulitis. Feeling 60% better.  Has been taping 2nd and 3rd toes. Met pads were not comfortable.  Not diabetic  no anti coag    HPI: 38 y.o. male presents today following up for right second toe hyperextension injury.  Reports some gradual improvement, feeling about 60% better.  Asking about clarification for taping technique and offloading pad technique.  Past Medical History:  Diagnosis Date   GERD (gastroesophageal reflux disease)     Past Surgical History:  Procedure Laterality Date   FRACTURE SURGERY      No Known Allergies  ROS    Physical Exam: There were no vitals filed for this visit.  General: The patient is alert and oriented x3 in no acute distress.  Dermatology: Skin is warm, dry and supple bilateral lower extremities. Interspaces are clear of maceration and debris.    Vascular: Palpable pedal pulses bilaterally. Capillary refill within normal limits.  No appreciable edema.  No erythema or calor.  Neurological: Light touch sensation grossly intact bilateral feet.   Musculoskeletal Exam: Right foot pain on palpation of 2nd greater than 3rd MTPJ.  Some pain with end stage dorsiflexion and plantarflexion.  Subtle increased Lachman sign right second MPJ. Reducible hammertoes.  Radiographic Exam: Right foot 3 views weightbearing 04/25/24 Normal osseous mineralization. Joint spaces preserved.  No fractures or osseous irregularities noted. Hammertoe deformities present.  Assessment/Plan of Care: 1. Capsulitis of metatarsophalangeal (MTP) joint of right foot   2. Injury of plantar plate of right foot, subsequent encounter      No orders of the defined types were placed in this encounter.  None  Discussed clinical findings with patient today.  Has had some benefit from onset with regular taping and bracing of the second MPJ.  We demonstrated the second MPJ taping technique and  proper use of the metatarsal pads.  States that he has not been taking meloxicam  frequently, encouraged continued use of this as he will likely continue to have some benefit.  RICE therapy as well as needed.  Follow-up in about 4 weeks for reevaluation.  If still symptomatic can consider a corticosteroid injection  Jaquelyn Sakamoto L. Lamount MAUL, AACFAS [facility]     [address] [place], [place] [address]                Office [phone]  Fax [phone]

## 2024-06-13 DIAGNOSIS — F39 Unspecified mood [affective] disorder: Secondary | ICD-10-CM | POA: Diagnosis not present

## 2024-06-13 DIAGNOSIS — F411 Generalized anxiety disorder: Secondary | ICD-10-CM | POA: Diagnosis not present

## 2024-06-20 DIAGNOSIS — F411 Generalized anxiety disorder: Secondary | ICD-10-CM | POA: Diagnosis not present

## 2024-06-20 DIAGNOSIS — F39 Unspecified mood [affective] disorder: Secondary | ICD-10-CM | POA: Diagnosis not present

## 2024-06-27 DIAGNOSIS — F411 Generalized anxiety disorder: Secondary | ICD-10-CM | POA: Diagnosis not present

## 2024-06-27 DIAGNOSIS — F39 Unspecified mood [affective] disorder: Secondary | ICD-10-CM | POA: Diagnosis not present

## 2024-07-03 ENCOUNTER — Ambulatory Visit: Admitting: Podiatry

## 2024-07-04 DIAGNOSIS — F411 Generalized anxiety disorder: Secondary | ICD-10-CM | POA: Diagnosis not present

## 2024-07-04 DIAGNOSIS — F39 Unspecified mood [affective] disorder: Secondary | ICD-10-CM | POA: Diagnosis not present

## 2024-08-01 DIAGNOSIS — F411 Generalized anxiety disorder: Secondary | ICD-10-CM | POA: Diagnosis not present

## 2024-08-01 DIAGNOSIS — F39 Unspecified mood [affective] disorder: Secondary | ICD-10-CM | POA: Diagnosis not present
